# Patient Record
Sex: Female | Born: 1990 | Race: White | Hispanic: No | Marital: Married | Smoking: Never smoker
Health system: Southern US, Community
[De-identification: ages and names within clinical notes are randomized; demographics above are authoritative.]

## PROBLEM LIST (undated history)

## (undated) ENCOUNTER — Inpatient Hospital Stay (HOSPITAL_COMMUNITY): Payer: Self-pay

## (undated) DIAGNOSIS — N200 Calculus of kidney: Secondary | ICD-10-CM

## (undated) DIAGNOSIS — R87629 Unspecified abnormal cytological findings in specimens from vagina: Secondary | ICD-10-CM

## (undated) DIAGNOSIS — Z8619 Personal history of other infectious and parasitic diseases: Secondary | ICD-10-CM

## (undated) HISTORY — PX: COLPOSCOPY: SHX161

## (undated) HISTORY — PX: OTHER SURGICAL HISTORY: SHX169

## (undated) HISTORY — DX: Personal history of other infectious and parasitic diseases: Z86.19

## (undated) HISTORY — PX: PILONIDAL CYST EXCISION: SHX744

## (undated) HISTORY — DX: Calculus of kidney: N20.0

## (undated) HISTORY — PX: TONSILLECTOMY: SUR1361

## (undated) HISTORY — DX: Unspecified abnormal cytological findings in specimens from vagina: R87.629

---
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
## 2009-04-03 ENCOUNTER — Encounter: Admission: RE | Admit: 2009-04-03 | Discharge: 2009-04-03 | Payer: Self-pay | Admitting: Otolaryngology

## 2014-02-18 LAB — OB RESULTS CONSOLE GC/CHLAMYDIA
CHLAMYDIA, DNA PROBE: NEGATIVE
GC PROBE AMP, GENITAL: NEGATIVE

## 2014-02-18 LAB — OB RESULTS CONSOLE RUBELLA ANTIBODY, IGM: RUBELLA: IMMUNE

## 2014-02-18 LAB — OB RESULTS CONSOLE HEPATITIS B SURFACE ANTIGEN: Hepatitis B Surface Ag: NEGATIVE

## 2014-02-18 LAB — OB RESULTS CONSOLE ABO/RH: RH Type: POSITIVE

## 2014-02-18 LAB — OB RESULTS CONSOLE RPR: RPR: NONREACTIVE

## 2014-02-18 LAB — OB RESULTS CONSOLE ANTIBODY SCREEN: Antibody Screen: NEGATIVE

## 2014-02-18 LAB — OB RESULTS CONSOLE HIV ANTIBODY (ROUTINE TESTING): HIV: NONREACTIVE

## 2014-05-27 ENCOUNTER — Other Ambulatory Visit: Payer: Self-pay | Admitting: Urology

## 2014-05-27 ENCOUNTER — Encounter (HOSPITAL_COMMUNITY): Payer: Self-pay | Admitting: *Deleted

## 2014-05-27 ENCOUNTER — Inpatient Hospital Stay (HOSPITAL_COMMUNITY)
Admission: AD | Admit: 2014-05-27 | Discharge: 2014-05-29 | DRG: 781 | Disposition: A | Discharge status: Home / Self Care | Payer: BLUE CROSS/BLUE SHIELD | Source: Ambulatory Visit | Attending: Obstetrics and Gynecology | Admitting: Obstetrics and Gynecology

## 2014-05-27 ENCOUNTER — Ambulatory Visit (HOSPITAL_COMMUNITY)
Admission: RE | Admit: 2014-05-27 | Discharge: 2014-05-27 | Disposition: A | Discharge status: Home / Self Care | Payer: BLUE CROSS/BLUE SHIELD | Source: Ambulatory Visit | Attending: Obstetrics and Gynecology | Admitting: Obstetrics and Gynecology

## 2014-05-27 ENCOUNTER — Other Ambulatory Visit (HOSPITAL_COMMUNITY): Payer: Self-pay | Admitting: Obstetrics and Gynecology

## 2014-05-27 ENCOUNTER — Encounter (HOSPITAL_COMMUNITY): Payer: Self-pay | Admitting: Certified Registered Nurse Anesthetist

## 2014-05-27 DIAGNOSIS — Z3A23 23 weeks gestation of pregnancy: Secondary | ICD-10-CM | POA: Diagnosis present

## 2014-05-27 DIAGNOSIS — N132 Hydronephrosis with renal and ureteral calculous obstruction: Secondary | ICD-10-CM | POA: Diagnosis present

## 2014-05-27 DIAGNOSIS — O9989 Other specified diseases and conditions complicating pregnancy, childbirth and the puerperium: Secondary | ICD-10-CM

## 2014-05-27 DIAGNOSIS — R109 Unspecified abdominal pain: Secondary | ICD-10-CM | POA: Insufficient documentation

## 2014-05-27 DIAGNOSIS — Z87442 Personal history of urinary calculi: Secondary | ICD-10-CM | POA: Insufficient documentation

## 2014-05-27 DIAGNOSIS — O26892 Other specified pregnancy related conditions, second trimester: Principal | ICD-10-CM

## 2014-05-27 DIAGNOSIS — N200 Calculus of kidney: Secondary | ICD-10-CM

## 2014-05-27 DIAGNOSIS — N1330 Unspecified hydronephrosis: Secondary | ICD-10-CM | POA: Diagnosis present

## 2014-05-27 LAB — URINALYSIS, ROUTINE W REFLEX MICROSCOPIC
BILIRUBIN URINE: NEGATIVE
GLUCOSE, UA: NEGATIVE mg/dL
HGB URINE DIPSTICK: NEGATIVE
KETONES UR: 15 mg/dL — AB
LEUKOCYTES UA: NEGATIVE
Nitrite: NEGATIVE
PH: 6.5 (ref 5.0–8.0)
PROTEIN: NEGATIVE mg/dL
Specific Gravity, Urine: 1.015 (ref 1.005–1.030)
Urobilinogen, UA: 0.2 mg/dL (ref 0.0–1.0)

## 2014-05-27 LAB — COMPREHENSIVE METABOLIC PANEL
ALT: 20 U/L (ref 0–35)
AST: 24 U/L (ref 0–37)
Albumin: 3.3 g/dL — ABNORMAL LOW (ref 3.5–5.2)
Alkaline Phosphatase: 44 U/L (ref 39–117)
Anion gap: 8 (ref 5–15)
BILIRUBIN TOTAL: 0.6 mg/dL (ref 0.3–1.2)
BUN: 10 mg/dL (ref 6–23)
CALCIUM: 9.3 mg/dL (ref 8.4–10.5)
CO2: 23 mmol/L (ref 19–32)
CREATININE: 0.9 mg/dL (ref 0.50–1.10)
Chloride: 106 mEq/L (ref 96–112)
GFR, EST NON AFRICAN AMERICAN: 89 mL/min — AB (ref >90)
GLUCOSE: 80 mg/dL (ref 70–99)
Potassium: 3.8 mmol/L (ref 3.5–5.1)
SODIUM: 137 mmol/L (ref 135–145)
Total Protein: 6.2 g/dL (ref 6.0–8.3)

## 2014-05-27 LAB — CBC
HEMATOCRIT: 34 % — AB (ref 36.0–46.0)
HEMOGLOBIN: 11.5 g/dL — AB (ref 12.0–15.0)
MCH: 28.8 pg (ref 26.0–34.0)
MCHC: 33.8 g/dL (ref 30.0–36.0)
MCV: 85 fL (ref 78.0–100.0)
Platelets: 290 10*3/uL (ref 150–400)
RBC: 4 MIL/uL (ref 3.87–5.11)
RDW: 13.1 % (ref 11.5–15.5)
WBC: 13.7 10*3/uL — ABNORMAL HIGH (ref 4.0–10.5)

## 2014-05-27 MED ORDER — ONDANSETRON 8 MG/NS 50 ML IVPB
8.0000 mg | Freq: Four times a day (QID) | INTRAVENOUS | Status: DC
  Administered 2014-05-27 – 2014-05-28 (×3): 8 mg via INTRAVENOUS
  Filled 2014-05-27 (×6): qty 8

## 2014-05-27 MED ORDER — ONDANSETRON HCL 4 MG/2ML IJ SOLN: 4.0000 mg | Freq: Four times a day (QID) | INTRAMUSCULAR | Status: DC | PRN

## 2014-05-27 MED ORDER — SODIUM CHLORIDE 0.9 % IJ SOLN: 9.0000 mL | INTRAMUSCULAR | Status: DC | PRN

## 2014-05-27 MED ORDER — NALOXONE HCL 0.4 MG/ML IJ SOLN: 0.4000 mg | INTRAMUSCULAR | Status: DC | PRN

## 2014-05-27 MED ORDER — HYDROMORPHONE HCL 2 MG/ML IJ SOLN
2.0000 mg | Freq: Once | INTRAMUSCULAR | Status: AC
  Administered 2014-05-27: 2 mg via INTRAVENOUS
  Filled 2014-05-27: qty 1

## 2014-05-27 MED ORDER — DOCUSATE SODIUM 100 MG PO CAPS
100.0000 mg | ORAL_CAPSULE | Freq: Every day | ORAL | Status: DC
  Filled 2014-05-27 (×3): qty 1

## 2014-05-27 MED ORDER — ACETAMINOPHEN 325 MG PO TABS: 650.0000 mg | ORAL_TABLET | ORAL | Status: DC | PRN

## 2014-05-27 MED ORDER — PRENATAL MULTIVITAMIN CH
1.0000 | ORAL_TABLET | Freq: Every day | ORAL | Status: DC
  Filled 2014-05-27: qty 1

## 2014-05-27 MED ORDER — HYDROMORPHONE 0.3 MG/ML IV SOLN
INTRAVENOUS | Status: DC
  Administered 2014-05-27: 1.5 mg via INTRAVENOUS
  Administered 2014-05-27: 20:00:00 via INTRAVENOUS
  Administered 2014-05-28: 1.89 mg via INTRAVENOUS
  Administered 2014-05-28: 09:00:00 via INTRAVENOUS
  Administered 2014-05-28: 1.59 mg via INTRAVENOUS
  Filled 2014-05-27 (×2): qty 25

## 2014-05-27 MED ORDER — ZOLPIDEM TARTRATE 5 MG PO TABS: 5.0000 mg | ORAL_TABLET | Freq: Every evening | ORAL | Status: DC | PRN

## 2014-05-27 MED ORDER — HYDROMORPHONE HCL 2 MG/ML IJ SOLN
2.0000 mg | INTRAMUSCULAR | Status: DC | PRN
  Administered 2014-05-27: 2 mg via INTRAVENOUS
  Filled 2014-05-27: qty 1

## 2014-05-27 MED ORDER — CALCIUM CARBONATE ANTACID 500 MG PO CHEW
2.0000 | CHEWABLE_TABLET | ORAL | Status: DC | PRN
  Filled 2014-05-27: qty 2

## 2014-05-27 MED ORDER — DIPHENHYDRAMINE HCL 50 MG/ML IJ SOLN
12.5000 mg | Freq: Four times a day (QID) | INTRAMUSCULAR | Status: DC | PRN
  Administered 2014-05-27 – 2014-05-28 (×2): 12.5 mg via INTRAVENOUS
  Filled 2014-05-27 (×2): qty 1

## 2014-05-27 MED ORDER — LACTATED RINGERS IV SOLN
INTRAVENOUS | Status: DC
  Administered 2014-05-27 – 2014-05-28 (×2): via INTRAVENOUS
  Administered 2014-05-28: 1000 mL via INTRAVENOUS

## 2014-05-27 MED ORDER — DIPHENHYDRAMINE HCL 12.5 MG/5ML PO ELIX: 12.5000 mg | ORAL_SOLUTION | Freq: Four times a day (QID) | ORAL | Status: DC | PRN

## 2014-05-27 MED ORDER — ONDANSETRON 8 MG/NS 50 ML IVPB
8.0000 mg | Freq: Once | INTRAVENOUS | Status: AC
  Administered 2014-05-27: 8 mg via INTRAVENOUS
  Filled 2014-05-27: qty 8

## 2014-05-27 MED ORDER — PROMETHAZINE HCL 25 MG/ML IJ SOLN
12.5000 mg | Freq: Four times a day (QID) | INTRAMUSCULAR | Status: DC | PRN
  Administered 2014-05-28 (×2): 12.5 mg via INTRAVENOUS
  Filled 2014-05-27 (×2): qty 1

## 2014-05-27 MED ORDER — LACTATED RINGERS IV BOLUS (SEPSIS)
1000.0000 mL | Freq: Once | INTRAVENOUS | Status: AC
  Administered 2014-05-27: 1000 mL via INTRAVENOUS

## 2014-05-27 NOTE — MAU Note (Signed)
Rt flank started yesterday.  Just had US, MD called - she needs a stint.

## 2014-05-27 NOTE — Consult Note (Signed)
Reason for Consult: Right Hydronephrosis, Flank Pain, History of Neprholithiasis  Referring Physician: Zelphia CairoGretchen Adkins MD  Paulita FujitaLauren Pew is an 24 y.o. female.   HPI:  1 - Right Severe Hydronephrosis, Flank Pain in 23rd week of Pregnancy - acute onset right flank pain and nausea / vomitting 05/2014, feels similar to prior colic. Renal US with mod-severe Rt hydro, dilated proximal ureter and no right ureteral jet. Pain refractory to PO percocet. She has h/o nephrolithiasis. No fevers / bacteruria.   2 -  Nephrolithiasis -  Pre 2016 - one episode left ureteral stone passed medically.   PMH sig for TNA, pilonidal cyst surgery. G1P0.   History reviewed. No pertinent past medical history.  Past Surgical History  Procedure Laterality Date  . Tonsillectomy      History reviewed. No pertinent family history.  Social History:  reports that she has never smoked. She does not have any smokeless tobacco history on file. She reports that she does not drink alcohol or use illicit drugs.  Allergies:  Allergies  Allergen Reactions  . Amoxil [Amoxicillin] Hives  . Ceclor [Cefaclor] Hives    Medications: I have reviewed the patient's current medications.  Results for orders placed or performed during the hospital encounter of 05/27/14 (from the past 48 hour(s))  CBC     Status: Abnormal   Collection Time: 05/27/14  1:30 PM  Result Value Ref Range   WBC 13.7 (H) 4.0 - 10.5 K/uL   RBC 4.00 3.87 - 5.11 MIL/uL   Hemoglobin 11.5 (L) 12.0 - 15.0 g/dL   HCT 78.234.0 (L) 95.636.0 - 21.346.0 %   MCV 85.0 78.0 - 100.0 fL   MCH 28.8 26.0 - 34.0 pg   MCHC 33.8 30.0 - 36.0 g/dL   RDW 08.613.1 57.811.5 - 46.915.5 %   Platelets 290 150 - 400 K/uL  Urinalysis, Routine w reflex microscopic     Status: Abnormal   Collection Time: 05/27/14  2:40 PM  Result Value Ref Range   Color, Urine YELLOW YELLOW   APPearance CLEAR CLEAR   Specific Gravity, Urine 1.015 1.005 - 1.030   pH 6.5 5.0 - 8.0   Glucose, UA NEGATIVE NEGATIVE  mg/dL   Hgb urine dipstick NEGATIVE NEGATIVE   Bilirubin Urine NEGATIVE NEGATIVE   Ketones, ur 15 (A) NEGATIVE mg/dL   Protein, ur NEGATIVE NEGATIVE mg/dL   Urobilinogen, UA 0.2 0.0 - 1.0 mg/dL   Nitrite NEGATIVE NEGATIVE   Leukocytes, UA NEGATIVE NEGATIVE    Comment: MICROSCOPIC NOT DONE ON URINES WITH NEGATIVE PROTEIN, BLOOD, LEUKOCYTES, NITRITE, OR GLUCOSE <1000 mg/dL.    [place] Renal  05/27/2014   CLINICAL DATA:  RIGHT flank pain since yesterday morning, history kidney stones, [redacted] weeks pregnant  EXAM: RENAL/URINARY TRACT ULTRASOUND COMPLETE  COMPARISON:  CT abdomen and pelvis 07/07/2012  FINDINGS: Right Kidney:  Length: 11.6 cm. Moderate RIGHT hydronephrosis, renal pelvis 3.6 cm diameter. Normal cortical thickness and echogenicity. No mass or shadowing calculi are sonographically evident.  Left Kidney:  Length: 11.8 cm. Normal morphology without mass or hydronephrosis. No shadowing calcifications.  Bladder:  Well distended, no obvious mass or wall thickening. LEFT ureteral jet well visualized. Diminished RIGHT ureteral jet noted.  Small hyperechoic focus is seen posterior to urinary bladder without definite shadowing, 6 x 5 x 5 mm, appears farther LEFT than expected on transverse image for the position of the distal RIGHT ureter (image 26). Patient did have LEFT side pelvic phleboliths noted on prior CT.  IMPRESSION: Moderate RIGHT hydronephrosis  with diminished RIGHT ureteral jet suggesting RIGHT-side urinary tract obstruction.   Electronically Signed   By: Ulyses Southward M.D.   On: 05/27/2014 10:57    Review of Systems  Constitutional: Negative.   HENT: Negative.   Eyes: Negative.   Respiratory: Negative.   Cardiovascular: Negative.   Gastrointestinal: Positive for nausea and vomiting.  Genitourinary: Positive for urgency and flank pain.  Musculoskeletal: Negative.   Skin: Negative.   Neurological: Negative.   Endo/Heme/Allergies: Negative.   Psychiatric/Behavioral: Negative.    Blood  pressure 113/60, pulse 79, temperature 98.2 F (36.8 C), temperature source Oral, resp. rate 18. Physical Exam  Constitutional: She appears well-developed and well-nourished.  Husband and parents at bedside  HENT:  Head: Normocephalic.  Eyes: Pupils are equal, round, and reactive to light.  Neck: Normal range of motion.  Cardiovascular: Normal rate.   Respiratory: Effort normal.  GI: Soft.  Gravid uetrus  Genitourinary:  Significant Rt CVAT  Musculoskeletal: Normal range of motion.  Neurological: She is alert.  Skin: Skin is warm.  Psychiatric: She has a normal mood and affect. Her behavior is normal. Judgment and thought content normal.    Assessment/Plan:  1 - Right Hydronephrosis, Flank Pain, h/o Nephrolithiasis in 23 wk Pregnancy - very high suspicion ureteral stone.   We discussed that stones / possible stones in pregnancy are a potentially dangerous entity with potential risks to both mother and baby. We discussed risks including infection (UTI) with obstruction, early labor induced by pain or procedure, and miscairrage. I mentioned that the definitive diagnosis is often complicated by the overall desire to reduce radiation exposure (if possible) with preferred ultrasound exams but that in certain circumstances proceeding with axial imaging (CT Scan) is indeed warranted and that absolute risks to mother and baby are small. We discussed management strategies including medical expulsive therapy / surveillance (with and without proph ABX), definitive ureteroscopy, or renal urinary diversion with stents or neph tubes, each with their own inherent risks, and that there is no uniformly accepted approach. I explained that our inclination towards definitive management and axial imaging is greater with increasing gestation as the imaging and surgical risks are lessened with increasing gestational age. I mentioned that stones are indeed more common in pregnancy and that at a minimum, the  patient should undergo CT stone after delivery to verify stone burden and strongly consider metabolic evaluation and future pre-pregnancy urologic evaluations to minimize risk to future pregnancies.   Discussed management options of CT v. Medical therapy v. Cysto retrograde / ureteroscopy for diagnostic and theraputic intent. They want to proceed with ureteroscopy with interval medical therapy.  We discussed ureteroscopic stone manipulation with basketing and laser-lithotripsy in detail. We discussed risks including bleeding, infection, damage to kidney / ureter bladder, rarely loss of kidney. We discussed anesthetic risks and rare but serious surgical complications including DVT, PE, MI, and mortality. We specifically addressed that in 5-10% of cases a staged approach is required with stenting followed by re-attempt ureteroscopy if anatomy unfavorable.   The patient voiced understanding and wises to proceed tomorrow as planned.   Will plan for maximally shielding fetus, minimal fluoroscopy, fetal monitoring, transfer back to antenatal unit post-op.  2 - Nephrolithiasis - kidneys appear presently stone free by [place], likely Rt ureteral stone as per above.    Janeisha Ryle 05/27/2014, 3:43 PM

## 2014-05-27 NOTE — H&P (Signed)
Cheryl Collins is a 24 y.o. female presenting for nephrolithiasis with right hydronephrosis, pain, & n/v.  Pt denies ctx, vb and lof.   History OB History    Gravida Para Term Preterm AB TAB SAB Ectopic Multiple Living   1              History reviewed. No pertinent past medical history. Past Surgical History  Procedure Laterality Date  . Tonsillectomy     Family History: family history is not on file. Social History:  reports that she has never smoked. She does not have any smokeless tobacco history on file. She reports that she does not drink alcohol or use illicit drugs.   Prenatal Transfer Tool  Maternal Diabetes: no Genetic Screening:  Maternal Ultrasounds/Referrals: rt hydro with decreased ureteral jets Fetal Ultrasounds or other Referrals:  none Maternal Substance Abuse:  none Significant Maternal Medications:  none Significant Maternal Lab Results:  None Other Comments:  None  ROS    Blood pressure 113/60, pulse 79, temperature 98.2 F (36.8 C), temperature source Oral, resp. rate 18, height 5\' 4"  (1.626 m), weight 80.287 kg (177 lb). Exam Physical Exam  Gen - NAD Abd - gravid, NT PV - deferred Prenatal labs: ABO, Rh:   Antibody:   Rubella:   RPR:    HBsAg:    HIV:    GBS:     Assessment/Plan: Nephrolithiasis with right hydronephrosis  Uro consulted and planning treatment in am - NPO after MN Admit for IVF and pain control   Cheryl Collins 05/27/2014, 8:18 PM

## 2014-05-27 NOTE — MAU Provider Note (Signed)
Chief Complaint:  Nephrolithiasis   First Provider Initiated Contact with Patient 05/27/14 1301     HPI: Cheryl Collins is a 24 y.o. G1P0 at [redacted]w[redacted]d who instructed to come to maternity admissions IV pain meds and antiemetics. Renal US this morning showed moderate right hydronephrosis and decrease right ureteral jets suggesting obstruction. Told she may need a stint. Has Hx Kidney stones on Left. Reports severe left flank pain and frequent N/V. No pain relief from Percocet. Able to void.   Denies contractions, leakage of fluid or vaginal bleeding. Good fetal movement.   Past Medical History: Kidney stones  Past obstetric history: OB History  Gravida Para Term Preterm AB SAB TAB Ectopic Multiple Living  1             # Outcome Date GA Lbr Len/2nd Weight Sex Delivery Anes PTL Lv  1 Current               Past Surgical History: Past Surgical History  Procedure Laterality Date  . Tonsillectomy       Family History: History reviewed. No pertinent family history.  Social History: History  Substance Use Topics  . Smoking status: Never Smoker   . Smokeless tobacco: Not on file  . Alcohol Use: No    Allergies:  Allergies  Allergen Reactions  . Amoxil [Amoxicillin] Hives  . Ceclor [Cefaclor] Hives    Meds:  Prescriptions prior to admission  Medication Sig Dispense Refill Last Dose  . acetaminophen (TYLENOL) 500 MG tablet Take 1,000 mg by mouth every 6 (six) hours as needed for moderate pain.   05/26/2014 at Unknown time  . nitrofurantoin (MACRODANTIN) 100 MG capsule Take 100 mg by mouth 2 (two) times daily. 7 days supply started 05-26-14   05/27/2014 at Unknown time  . oxyCODONE-acetaminophen (PERCOCET) 7.5-325 MG per tablet Take 1 tablet by mouth every 4 (four) hours as needed for pain.   05/27/2014 at Unknown time  . Prenatal Vit-Fe Fumarate-FA (PRENATAL MULTIVITAMIN) TABS tablet Take 1 tablet by mouth daily at 12 noon.   Past Week at Unknown time    ROS: Pertinent findings in history  of present illness. Denies fever, chills, diarrhea, constipation, abd pain, VB, hematuria, dysuria, urgency, frequency.   Physical Exam  Blood pressure 121/48, pulse 77, temperature 98.2 F (36.8 C), temperature source Oral, resp. rate 18, height  (1.626 m), weight 80.287 kg (177 lb), SpO2 100 %.   GENERAL: Well-developed, well-nourished female in moderate distress.  HEENT: normocephalic HEART: normal rate RESP: normal effort ABDOMEN: Soft, non-tender, gravid appropriate for gestational age. Pos left CVAT.  EXTREMITIES: Nontender, no edema NEURO: alert and oriented SPECULUM EXAM: Deferred    FHT:  Baseline 140's , min-moderate variability, 10x10 accelerations present, no decelerations Contractions: None   Labs: Results for orders placed or performed during the hospital encounter of 05/27/14 (from the past 24 hour(s))  CBC     Status: Abnormal   Collection Time: 05/27/14  1:30 PM  Result Value Ref Range   WBC 13.7 (H) 4.0 - 10.5 K/uL   RBC 4.00 3.87 - 5.11 MIL/uL   Hemoglobin 11.5 (L) 12.0 - 15.0 g/dL   HCT 16.1 (L) 09.6 - 04.5 %   MCV 85.0 78.0 - 100.0 fL   MCH 28.8 26.0 - 34.0 pg   MCHC 33.8 30.0 - 36.0 g/dL   RDW 40.9 81.1 - 91.4 %   Platelets 290 150 - 400 K/uL  Comprehensive metabolic panel     Status: Abnormal  Collection Time: 05/27/14  1:30 PM  Result Value Ref Range   Sodium 137 135 - 145 mmol/L   Potassium 3.8 3.5 - 5.1 mmol/L   Chloride 106 96 - 112 mEq/L   CO2 23 19 - 32 mmol/L   Glucose, Bld 80 70 - 99 mg/dL   BUN 10 6 - 23 mg/dL   Creatinine, Ser 1.610.90 0.50 - 1.10 mg/dL   Calcium 9.3 8.4 - 09.610.5 mg/dL   Total Protein 6.2 6.0 - 8.3 g/dL   Albumin 3.3 (L) 3.5 - 5.2 g/dL   AST 24 0 - 37 U/L   ALT 20 0 - 35 U/L   Alkaline Phosphatase 44 39 - 117 U/L   Total Bilirubin 0.6 0.3 - 1.2 mg/dL   GFR calc non Af Amer 89 (L) >90 mL/min   GFR calc Af Amer >90 >90 mL/min   Anion gap 8 5 - 15  Urinalysis, Routine w reflex microscopic     Status: Abnormal    Collection Time: 05/27/14  2:40 PM  Result Value Ref Range   Color, Urine YELLOW YELLOW   APPearance CLEAR CLEAR   Specific Gravity, Urine 1.015 1.005 - 1.030   pH 6.5 5.0 - 8.0   Glucose, UA NEGATIVE NEGATIVE mg/dL   Hgb urine dipstick NEGATIVE NEGATIVE   Bilirubin Urine NEGATIVE NEGATIVE   Ketones, ur 15 (A) NEGATIVE mg/dL   Protein, ur NEGATIVE NEGATIVE mg/dL   Urobilinogen, UA 0.2 0.0 - 1.0 mg/dL   Nitrite NEGATIVE NEGATIVE   Leukocytes, UA NEGATIVE NEGATIVE    Imaging:  [place] Renal  05/27/2014   CLINICAL DATA:  RIGHT flank pain since yesterday morning, history kidney stones, [redacted] weeks pregnant  EXAM: RENAL/URINARY TRACT ULTRASOUND COMPLETE  COMPARISON:  CT abdomen and pelvis 07/07/2012  FINDINGS: Right Kidney:  Length: 11.6 cm. Moderate RIGHT hydronephrosis, renal pelvis 3.6 cm diameter. Normal cortical thickness and echogenicity. No mass or shadowing calculi are sonographically evident.  Left Kidney:  Length: 11.8 cm. Normal morphology without mass or hydronephrosis. No shadowing calcifications.  Bladder:  Well distended, no obvious mass or wall thickening. LEFT ureteral jet well visualized. Diminished RIGHT ureteral jet noted.  Small hyperechoic focus is seen posterior to urinary bladder without definite shadowing, 6 x 5 x 5 mm, appears farther LEFT than expected on transverse image for the position of the distal RIGHT ureter (image 26). Patient did have LEFT side pelvic phleboliths noted on prior CT.  IMPRESSION: Moderate RIGHT hydronephrosis with diminished RIGHT ureteral jet suggesting RIGHT-side urinary tract obstruction.   Electronically Signed   By: Ulyses SouthwardMark  Boles M.D.   On: 05/27/2014 10:57   MAU Course: [place] Renal US, severe pain w/ Dr. Renaldo FiddlerAdkins. Ordered urology consult ASAP. Discussed pt w/ Urologist Dr. Berneice HeinrichManny. Pt needs ureteroscopy ASAP. Will come to MAU for BS consult in ~2 hours. Will discuss POC w. Dr. Renaldo FiddlerAdkins.   Assessment: Possible right ureteral stone w/ obsruction.  [redacted]  weeks gestation Pain and N/V uncontrolled by PO meds.   Plan: Admit to Antenatal per consult w/ Dr. Renaldo FiddlerAdkins. Plan ureteroscopy tomorrow NPO after NM IV fluids, pain meds, antiemetics.  [place]oun[place]ia Soren Pigman, CNM 05/27/2014 11:01 PM

## 2014-05-28 ENCOUNTER — Inpatient Hospital Stay (HOSPITAL_COMMUNITY): Payer: BLUE CROSS/BLUE SHIELD

## 2014-05-28 ENCOUNTER — Inpatient Hospital Stay (HOSPITAL_COMMUNITY): Payer: BLUE CROSS/BLUE SHIELD | Admitting: Anesthesiology

## 2014-05-28 ENCOUNTER — Encounter (HOSPITAL_COMMUNITY): Payer: Self-pay | Admitting: *Deleted

## 2014-05-28 ENCOUNTER — Encounter (HOSPITAL_COMMUNITY)
Admission: AD | Disposition: A | Discharge status: Home / Self Care | Payer: Self-pay | Source: Ambulatory Visit | Attending: Obstetrics and Gynecology

## 2014-05-28 HISTORY — PX: CYSTOSCOPY WITH STENT PLACEMENT: SHX5790

## 2014-05-28 HISTORY — PX: URETEROSCOPY: SHX842

## 2014-05-28 HISTORY — PX: CYSTOSCOPY W/ RETROGRADES: SHX1426

## 2014-05-28 SURGERY — CYSTOSCOPY, WITH RETROGRADE PYELOGRAM
Anesthesia: General | Laterality: Right

## 2014-05-28 MED ORDER — PROPOFOL 10 MG/ML IV BOLUS
INTRAVENOUS | Status: DC | PRN
  Administered 2014-05-28: 160 mg via INTRAVENOUS

## 2014-05-28 MED ORDER — 0.9 % SODIUM CHLORIDE (POUR BTL) OPTIME
TOPICAL | Status: DC | PRN
  Administered 2014-05-28: 1000 mL

## 2014-05-28 MED ORDER — FENTANYL CITRATE 0.05 MG/ML IJ SOLN
INTRAMUSCULAR | Status: AC
  Filled 2014-05-28: qty 4

## 2014-05-28 MED ORDER — SODIUM CHLORIDE 0.9 % IR SOLN
Status: DC | PRN
  Administered 2014-05-28: 1000 mL

## 2014-05-28 MED ORDER — PROPOFOL 10 MG/ML IV BOLUS
INTRAVENOUS | Status: AC
  Filled 2014-05-28: qty 20

## 2014-05-28 MED ORDER — ACETAMINOPHEN 325 MG PO TABS: 325.0000 mg | ORAL_TABLET | ORAL | Status: DC | PRN

## 2014-05-28 MED ORDER — OXYCODONE-ACETAMINOPHEN 5-325 MG PO TABS
1.0000 | ORAL_TABLET | ORAL | Status: DC | PRN
  Administered 2014-05-28 (×2): 2 via ORAL
  Filled 2014-05-28 (×2): qty 2

## 2014-05-28 MED ORDER — ONDANSETRON HCL 4 MG/2ML IJ SOLN
INTRAMUSCULAR | Status: DC | PRN
  Administered 2014-05-28: 4 mg via INTRAVENOUS

## 2014-05-28 MED ORDER — PHENYLEPHRINE HCL 10 MG/ML IJ SOLN
INTRAMUSCULAR | Status: AC
  Filled 2014-05-28: qty 1

## 2014-05-28 MED ORDER — GENTAMICIN IN SALINE 1.6-0.9 MG/ML-% IV SOLN: 80.0000 mg | INTRAVENOUS | Status: DC

## 2014-05-28 MED ORDER — ACETAMINOPHEN 160 MG/5ML PO SOLN
325.0000 mg | ORAL | Status: DC | PRN
  Filled 2014-05-28: qty 20.3

## 2014-05-28 MED ORDER — GENTAMICIN SULFATE 40 MG/ML IJ SOLN
80.3000 mg | INTRAMUSCULAR | Status: DC
  Filled 2014-05-28: qty 2

## 2014-05-28 MED ORDER — GENTAMICIN SULFATE 40 MG/ML IJ SOLN
400.0000 mg | Freq: Once | INTRAVENOUS | Status: AC
  Administered 2014-05-28: 400 mg via INTRAVENOUS
  Filled 2014-05-28: qty 10

## 2014-05-28 MED ORDER — FENTANYL CITRATE 0.05 MG/ML IJ SOLN
INTRAMUSCULAR | Status: DC | PRN
  Administered 2014-05-28: 50 ug via INTRAVENOUS

## 2014-05-28 MED ORDER — SODIUM CHLORIDE 0.9 % IR SOLN
Status: DC | PRN
  Administered 2014-05-28: 3000 mL

## 2014-05-28 MED ORDER — FENTANYL CITRATE 0.05 MG/ML IJ SOLN: 25.0000 ug | INTRAMUSCULAR | Status: DC | PRN

## 2014-05-28 MED ORDER — ONDANSETRON HCL 4 MG/2ML IJ SOLN
INTRAMUSCULAR | Status: AC
  Filled 2014-05-28: qty 2

## 2014-05-28 MED ORDER — SIMETHICONE 80 MG PO CHEW
160.0000 mg | CHEWABLE_TABLET | Freq: Four times a day (QID) | ORAL | Status: DC | PRN
  Administered 2014-05-28: 160 mg via ORAL
  Filled 2014-05-28: qty 2

## 2014-05-28 MED ORDER — PHENYLEPHRINE HCL 10 MG/ML IJ SOLN
10.0000 mg | INTRAVENOUS | Status: DC | PRN
  Administered 2014-05-28: 25 ug/min via INTRAVENOUS

## 2014-05-28 MED ORDER — MIDAZOLAM HCL 2 MG/2ML IJ SOLN
INTRAMUSCULAR | Status: AC
  Filled 2014-05-28: qty 2

## 2014-05-28 MED ORDER — SUCCINYLCHOLINE CHLORIDE 20 MG/ML IJ SOLN
INTRAMUSCULAR | Status: DC | PRN
  Administered 2014-05-28: 100 mg via INTRAVENOUS

## 2014-05-28 SURGICAL SUPPLY — 22 items
BAG URINE DRAINAGE (UROLOGICAL SUPPLIES) IMPLANT
BASKET LASER NITINOL 1.9FR (BASKET) ×2 IMPLANT
BASKET STNLS GEMINI 4WIRE 3FR (BASKET) IMPLANT
BASKET ZERO TIP NITINOL 2.4FR (BASKET) IMPLANT
CATH INTERMIT  6FR 70CM (CATHETERS) ×2 IMPLANT
CLOTH BEACON ORANGE TIMEOUT ST (SAFETY) ×2 IMPLANT
DRAPE CAMERA CLOSED 9X96 (DRAPES) ×2 IMPLANT
ELECT REM PT RETURN 9FT ADLT (ELECTROSURGICAL)
ELECTRODE REM PT RTRN 9FT ADLT (ELECTROSURGICAL) IMPLANT
FIBER LASER FLEXIVA 200 (UROLOGICAL SUPPLIES) IMPLANT
FIBER LASER FLEXIVA 365 (UROLOGICAL SUPPLIES) IMPLANT
GLOVE BIOGEL M STRL SZ7.5 (GLOVE) ×2 IMPLANT
GOWN STRL REUS W/TWL LRG LVL3 (GOWN DISPOSABLE) ×4 IMPLANT
GUIDEWIRE ANG ZIPWIRE 038X150 (WIRE) ×2 IMPLANT
GUIDEWIRE STR DUAL SENSOR (WIRE) ×2 IMPLANT
IV NS IRRIG 3000ML ARTHROMATIC (IV SOLUTION) ×2 IMPLANT
PACK CYSTO (CUSTOM PROCEDURE TRAY) ×2 IMPLANT
SHEATH ACCESS URETERAL 24CM (SHEATH) ×2 IMPLANT
STENT POLARIS 5FRX26 (STENTS) ×2 IMPLANT
SYRINGE 10CC LL (SYRINGE) IMPLANT
SYRINGE IRR TOOMEY STRL 70CC (SYRINGE) IMPLANT
TUBE FEEDING 8FR 16IN STR KANG (MISCELLANEOUS) ×2 IMPLANT

## 2014-05-28 NOTE — Anesthesia Preprocedure Evaluation (Signed)
Anesthesia Evaluation  Patient identified by MRN, date of birth, ID band Patient awake    Reviewed: Allergy & Precautions, NPO status , Patient's Chart, lab work & pertinent test results  Airway Mallampati: II  TM Distance: >3 FB Neck ROM: Full    Dental no notable dental hx.    Pulmonary neg pulmonary ROS,  breath sounds clear to auscultation  Pulmonary exam normal       Cardiovascular negative cardio ROS  Rhythm:Regular Rate:Normal     Neuro/Psych negative neurological ROS  negative psych ROS   GI/Hepatic negative GI ROS, Neg liver ROS,   Endo/Other  negative endocrine ROS  Renal/GU negative Renal ROS  negative genitourinary   Musculoskeletal negative musculoskeletal ROS (+)   Abdominal   Peds negative pediatric ROS (+)  Hematology negative hematology ROS (+)   Anesthesia Other Findings   Reproductive/Obstetrics (+) Pregnancy                             Anesthesia Physical Anesthesia Plan  ASA: II  Anesthesia Plan: General   Post-op Pain Management:    Induction: Intravenous  Airway Management Planned: Oral ETT  Additional Equipment:   Intra-op Plan:   Post-operative Plan: Extubation in OR  Informed Consent: I have reviewed the patients History and Physical, chart, labs and discussed the procedure including the risks, benefits and alternatives for the proposed anesthesia with the patient or authorized representative who has indicated his/her understanding and acceptance.   Dental advisory given  Plan Discussed with: CRNA  Anesthesia Plan Comments:         Anesthesia Quick Evaluation

## 2014-05-28 NOTE — Anesthesia Preprocedure Evaluation (Deleted)
Anesthesia Evaluation Anesthesia Physical Anesthesia Plan Anesthesia Quick Evaluation  

## 2014-05-28 NOTE — Brief Op Note (Signed)
05/27/2014 - 05/28/2014  11:05 AM  PATIENT:  Cheryl Collins  24 y.o. female  PRE-OPERATIVE DIAGNOSIS:  RIGHT HYDRONEPHROSIS, URETERAL STONE, [redacted] WEEKS PREGNANT  POST-OPERATIVE DIAGNOSIS:  RIGHT HYDRONEPHROSIS, URETERAL STONE, [redacted] WEEKS PREGNANT  PROCEDURE:  Procedure(s): CYSTOSCOPY WITH RETROGRADE PYELOGRAM (Right) URETEROSCOPY/STONE BASKET EXTRACTION (Right) CYSTOSCOPY WITH STENT PLACEMENT (Right)  SURGEON:  Surgeon(s) and Role:    * Sebastian Acheheodore Litisha Guagliardo, MD - Primary  PHYSICIAN ASSISTANT:   ASSISTANTS: none   ANESTHESIA:   general  EBL:     BLOOD ADMINISTERED:none  DRAINS: none   LOCAL MEDICATIONS USED:  NONE  SPECIMEN:  Source of Specimen:  Rt ureteral stone  DISPOSITION OF SPECIMEN:  [facility] for compositional analysis  COUNTS:  YES  TOURNIQUET:  * No tourniquets in log *  DICTATION: .Other Dictation: Dictation Number [id number]  PLAN OF CARE: Admit to inpatient   PATIENT DISPOSITION:  PACU - hemodynamically stable.   Delay start of Pharmacological VTE agent (>24hrs) due to surgical blood loss or risk of bleeding: yes

## 2014-05-28 NOTE — Progress Notes (Signed)
Subjective:  1 - Right Severe Hydronephrosis, Flank Pain in 23rd week of Pregnancy - acute onset right flank pain and nausea / vomitting 05/2014, feels similar to prior colic. Renal US with mod-severe Rt hydro, dilated proximal ureter and no right ureteral jet. Pain refractory to PO percocet. She has h/o nephrolithiasis. No fevers / bacteruria.   2 - Nephrolithiasis -  Pre 2016 - one episode left ureteral stone passed medically.   Today Cheryl Collins is seen to proceed with cysto and right ureteroscopy with stone manipulation versus stenting. No interval fevers or stone passage.   Objective: Vital signs in last 24 hours: Temp:  [98.2 F (36.8 C)-99.6 F (37.6 C)] 98.2 F (36.8 C) (01/05 1455) Pulse Rate:  [73-88] 73 (01/06 0001) Resp:  [18] 18 (01/06 0001) BP: (109-137)/(41-67) 109/41 mmHg (01/06 0001) SpO2:  [100 %] 100 % (01/05 2012) Weight:  [80.287 kg (177 lb)] 80.287 kg (177 lb) (01/05 1724)    Intake/Output from previous day:   Intake/Output this shift:    General appearance: alert, cooperative and appears stated age Head: Normocephalic, without obvious abnormality, atraumatic Eyes: negative Nose: Nares normal. Septum midline. Mucosa normal. No drainage or sinus tenderness. Throat: lips, mucosa, and tongue normal; teeth and gums normal Neck: supple, symmetrical, trachea midline Back: symmetric, no curvature. ROM normal. No CVA tenderness. Resp: non-labored on room air Cardio: Nl rate GI: soft, non-tender; bowel sounds normal; no masses,  no organomegaly Pelvic: gravid uterus to just below umbilicus Extremities: extremities normal, atraumatic, no cyanosis or edema Skin: Skin color, texture, turgor normal. No rashes or lesions Lymph nodes: Cervical, supraclavicular, and axillary nodes normal. Neurologic: Grossly normal Stable Rt CVAT  Lab Results:   Recent Labs  05/27/14 1330  WBC 13.7*  HGB 11.5*  HCT 34.0*  PLT 290   BMET  Recent Labs  05/27/14 1330  NA  137  K 3.8  CL 106  CO2 23  GLUCOSE 80  BUN 10  CREATININE 0.90  CALCIUM 9.3   PT/INR No results for input(s): LABPROT, INR in the last 72 hours. ABG No results for input(s): PHART, HCO3 in the last 72 hours.  Invalid input(s): PCO2, PO2  Studies/Results: US Renal  05/27/2014   CLINICAL DATA:  RIGHT flank pain since yesterday morning, history kidney stones, [redacted] weeks pregnant  EXAM: RENAL/URINARY TRACT ULTRASOUND COMPLETE  COMPARISON:  CT abdomen and pelvis 07/07/2012  FINDINGS: Right Kidney:  Length: 11.6 cm. Moderate RIGHT hydronephrosis, renal pelvis 3.6 cm diameter. Normal cortical thickness and echogenicity. No mass or shadowing calculi are sonographically evident.  Left Kidney:  Length: 11.8 cm. Normal morphology without mass or hydronephrosis. No shadowing calcifications.  Bladder:  Well distended, no obvious mass or wall thickening. LEFT ureteral jet well visualized. Diminished RIGHT ureteral jet noted.  Small hyperechoic focus is seen posterior to urinary bladder without definite shadowing, 6 x 5 x 5 mm, appears farther LEFT than expected on transverse image for the position of the distal RIGHT ureter (image 26). Patient did have LEFT side pelvic phleboliths noted on prior CT.  IMPRESSION: Moderate RIGHT hydronephrosis with diminished RIGHT ureteral jet suggesting RIGHT-side urinary tract obstruction.   Electronically Signed   By: Cheryl Collins M.D.   On: 05/27/2014 10:57    Anti-infectives: Anti-infectives    Start     Dose/Rate Route Frequency Ordered Stop   05/28/14 0900  gentamicin (GARAMYCIN) IVPB 80 mg  Status:  Discontinued     80 mg100 mL/hr over 30 Minutes Intravenous 30 min  pre-op 05/28/14 0359 05/28/14 0401   05/28/14 0900  gentamicin (GARAMYCIN) 80.3 mg in dextrose 5 % 50 mL IVPB     80.3 mg104 mL/hr over 30 Minutes Intravenous 30 min pre-op 05/28/14 0402     05/28/14 0355  gentamicin (GARAMYCIN) IVPB 80 mg  Status:  Discontinued     80 mg100 mL/hr over 30 Minutes  Intravenous 30 min pre-op 05/28/14 0355 05/28/14 0358      Assessment/Plan:  1 - Right Hydronephrosis, Flank Pain, h/o Nephrolithiasis in 24 wk Pregnancy - Proceed as planned this AM with cysto, right ureteroscopy and stone manipulation and / or stenting pending intraoperative findings. Risks and benefits to mother and fetus reiterated, pt voiced understanding and desires to proceed.  J. Cheryl Collins, Cheryl Collins 05/28/2014

## 2014-05-28 NOTE — Transfer of Care (Signed)
Immediate Anesthesia Transfer of Care Note  Patient: Cheryl Collins  Procedure(s) Performed: Procedure(s): CYSTOSCOPY WITH RETROGRADE PYELOGRAM (Right) URETEROSCOPY/STONE BASKET EXTRACTION (Right) CYSTOSCOPY WITH STENT PLACEMENT (Right)  Patient Location: PACU  Anesthesia Type:General  Level of Consciousness: awake, sedated and patient cooperative  Airway & Oxygen Therapy: Patient Spontanous Breathing and Patient connected to face mask oxygen  Post-op Assessment: Report given to PACU RN and Post -op Vital signs reviewed and stable  Post vital signs: Reviewed and stable  Complications: No apparent anesthesia complications

## 2014-05-28 NOTE — Discharge Instructions (Signed)
1 - You may have urinary urgency (bladder spasms) and bloody urine on / off with stent in place. This is normal.  2 - Call MD or go to ER for fever >102, severe pain / nausea / vomiting not relieved by medications, or acute change in medical status  3 - Remove tethered stent on Friday morning by pulling on string, then blue/white plastic, and discarding.

## 2014-05-28 NOTE — Anesthesia Postprocedure Evaluation (Signed)
  Anesthesia Post-op Note  Patient: Paulita FujitaLauren Life  Procedure(s) Performed: Procedure(s): CYSTOSCOPY WITH RETROGRADE PYELOGRAM (Right) URETEROSCOPY/STONE BASKET EXTRACTION (Right) CYSTOSCOPY WITH STENT PLACEMENT (Right)  Patient Location: PACU  Anesthesia Type:General  Level of Consciousness: awake  Airway and Oxygen Therapy: Patient Spontanous Breathing  Post-op Pain: mild  Post-op Assessment: Post-op Vital signs reviewed, Patient's Cardiovascular Status Stable, Respiratory Function Stable, Patent Airway, No signs of Nausea or vomiting and Pain level controlled  Post-op Vital Signs: Reviewed and stable  Last Vitals:  Filed Vitals:   05/28/14 1300  BP: 122/54  Pulse: 97  Temp:   Resp: 16    Complications: No apparent anesthesia complications

## 2014-05-28 NOTE — Progress Notes (Signed)
Montez MoritaErin Hampton, R.N.-O.B. Rapid Response R.N. With patient.

## 2014-05-28 NOTE — Anesthesia Procedure Notes (Signed)
Procedure Name: Intubation Date/Time: 05/28/2014 10:28 AM Performed by: Paulla DollyJOYCE, Jerel Sardina A Pre-anesthesia Checklist: Patient identified, Timeout performed, Emergency Drugs available, Suction available and Patient being monitored Patient Re-evaluated:Patient Re-evaluated prior to inductionOxygen Delivery Method: Circle system utilized and Simple face mask Preoxygenation: Pre-oxygenation with 100% oxygen Intubation Type: Combination inhalational/ intravenous induction and Cricoid Pressure applied Ventilation: Mask ventilation without difficulty Laryngoscope Size: Mac and 4 Grade View: Grade I Tube type: Oral Tube size: 7.0 mm Number of attempts: 1 Placement Confirmation: ETT inserted through vocal cords under direct vision,  breath sounds checked- equal and bilateral and positive ETCO2 Secured at: 22 cm Tube secured with: Tape Dental Injury: Teeth and Oropharynx as per pre-operative assessment

## 2014-05-28 NOTE — Progress Notes (Signed)
[facility]

## 2014-05-29 ENCOUNTER — Encounter (HOSPITAL_COMMUNITY): Payer: Self-pay | Admitting: Urology

## 2014-05-29 MED ORDER — NITROFURANTOIN MACROCRYSTAL 100 MG PO CAPS: 100.0000 mg | ORAL_CAPSULE | Freq: Every day | ORAL | Status: DC

## 2014-05-29 NOTE — Op Note (Signed)
NAMHassan Collins:  Metzger, Cheryl Collins                ACCOUNT NO.:  [id number]  MEDICAL RECORD NO.:  [id number]  LOCATION:  9156                          FACILITY:  [facility]  PHYSICIAN:  Sebastian Acheheodore Shadiyah Wernli, MD     DATE OF BIRTH:  [birth year]  DATE OF PROCEDURE: 05/28/2014 DATE OF DISCHARGE:                              OPERATIVE REPORT   DIAGNOSES:  Right hydronephrosis refractory to renal colic.  Right ureteral stone.  PROCEDURES: 1. Cystoscopy with right retrograde pyelogram and interpretation. 2. Right ureteroscopy with basketing of stone. 3. Insertion of right ureteral stent, 5 x 26 Polaris with tether to     the thigh.  ESTIMATED BLOOD LOSS:  Nil.  COMPLICATIONS:  None.  SPECIMEN:  Small right ureteral stone for compositional analysis.  FINDINGS: 1. Moderate-to-severe right hydroureteronephrosis with significant     proximal tortuosity. 2. Multifocal right ureteral stones, distal, impacted, matrix stone,     proximal, smaller mineral base stone. 3. Complete resolution of all stone fragments larger than 1/3 mm     verified by complete ureterorenoscopy. 4. Excellent placement of right ureteral stent proximal in renal     pelvis, distal end, urinary bladder with tether to the thigh.  INDICATION:  Ms. Cheryl Collins is a very pleasant 24 year old lady in the midst of her first pregnancy who has a history of nephrolithiasis.  [facility] with approximately 1- day prodrome, severe right colicky flank pain, consistent with prior nephrolithiasis, this is requiring IV narcotics, IV pain meds, IV nausea meds and she also had significant nausea, vomiting with this.  Renal ultrasound corroborated significant hydroureteronephrosis, no definitive stone seen, no large intrarenal stones.  This picture was most consistent with likely right ureteral stone as the cause of this. Options were discussed including continued medical therapy versus stenting alone versus attempted  ureteroscopy with definitive management. She wished to proceed with the latter.  Informed consent was obtained and placed in the medical record.  PROCEDURE IN DETAIL:  The patient being Cheryl Collins, was verified. Procedure being right ureteroscopic stone manipulation was confirmed. Procedure was carried out.  Time-out was performed.  Intravenous antibiotics were administered.  General endotracheal anesthesia was induced.  The patient was placed into a low lithotomy position and sterile field was created by prepping and draping the patient's vagina, introitus, and proximal thighs using iodine x3.  Next, small bump was placed under her right flank to help position the gravid uterus away from any potential fluoroscopy.  Cystourethroscopy was then performed using 22-French rigid cystoscope with 12-degree offset lens.  Inspection of the urinary bladder revealed no diverticula, calcifications, or papular lesions.  The right ureteral orifice was gently cannulated with a 6-French end-hole catheter and very gentle right retrograde pyelogram was obtained.  Right retrograde pyelogram demonstrated a single system right kidney with severe hydroureteronephrosis with some proximal tortuosity. Purposely, images were obtained using pulse-mode, low-dose fluoroscopy and only of the kidney to avoid any radiation directly to the gravid uterus.  A 0.038 zip wire was advanced at the level of the upper pole and set aside as a safety wire.  Next, semi-rigid ureteroscopy was performed in the distal four-fifth of the  right ureter alongside a separate Sensor working wire in the level of the distal ureter and area of impacted likely matrix stone was seen, this was grasped with a basket, which cut this nearly completely disintegrate, this was removed in its entirety.  There was significant dilation of the ureter proximal to this as the goal today was to verify stone-free status.  The proximal ureter was inspected,  which again was quite dilated, but no additional calcifications were found.  It was felt that inspection of the kidney was clearly warranted as well to maximally rule out additional stent. As such, the semi-rigid ureteroscope was exchanged for the 12/14, 24-cm ureteral access sheath over the Sensor working wire to the level of the proximal ureter and flexible digital ureteroscopy was performed of the proximal ureter and systematic inspection of the kidney using very low pressure, minimal irrigation.  There was an additional stone in the most proximal ureter, this appeared to be quite small; however, it was appeared to be mineral base.  This was grasped with the Escape basket and brought out in its entirety, set aside for compositional analysis. Systematic inspection of the kidney with each calyx x2.  Again, taking great care to avoid excessive pressure was performed, no additional calcifications were found.  The ureteroscope and sheath were removed under continuous uteroscopic vision and no mucosal abnormalities were found.  There were some edema in the distal most ureter consistent with likely prior status in site of stone impaction.  It was felt that the stenting would be clearly warranted to prevent postop edema-based colic and as such, a new 5 x 26 Polaris-type stent was placed using very careful fluoroscopic guidance, good proximal deployment in the renal pelvis and distal in the urinary bladder.  Tether was left in place, fashioned to the thigh.  During all the ureteroscopic portions, an 8- [place] feeding tube was in the urinary bladder for pressure release. Procedure was then terminated.  The patient tolerated the procedure well.  There were no immediate periprocedural complications.  The patient was taken to postanesthesia care unit in stable condition with plan for confirming the fetal heart tones in the PACU and then transferring back to [facility] to the primary gynecologic  team.          ______________________________ Sebastian Ache, MD     TM/MEDQ  D:  05/28/2014  T:  05/29/2014  Job:  [id number]

## 2014-05-29 NOTE — Progress Notes (Signed)
Patient ID: Cheryl FujitaLauren Collins, female   DOB: 08/21/1990, 24 y.o.   MRN: [id number] Pt without complaints GFM Pain minimal after surgery yesterday  VSSAF FHR 145 no ctxs  Right flank minimal pain Abd Gravid nt  Right hydronephrosis and stone - s/p stone removal and stent placement Ok for dc per urology Pt to remove stent tomorrow and fu with urology in 2 weeks (per Urology) Complete macrobid  Course (currently on) Has Rx Percocet DL

## 2014-06-02 NOTE — Discharge Summary (Signed)
Obstetric Discharge Summary Reason for Admission: observation/evaluation Prenatal Procedures: ultrasound Intrapartum Procedures: na Postpartum Procedures: na Complications-Operative and Postpartum: na HEMOGLOBIN  Date Value Ref Range Status  05/27/2014 11.5* 12.0 - 15.0 g/dL Final   HCT  Date Value Ref Range Status  05/27/2014 34.0* 36.0 - 46.0 % Final    Physical Exam:  General: alert and cooperative Lochia: na Uterine Fundus: gravid Incision: na DVT Evaluation: No evidence of DVT seen on physical exam. Negative Homan's sign. No cords or calf tenderness.  Discharge Diagnoses: 23 week IUP with h/o hydronephrosis and renal calculi, s/p R ureteroscopy with basketing of stone  Discharge Information: Date: 06/02/2014 Activity: pelvic rest Diet: routine Medications: PNV, Percocet and macrobid Condition: improved Instructions: refer to practice specific booklet Discharge to: home Follow-up Information    Follow up with Sebastian AcheMANNY, THEODORE, MD.   Specialty:  Urology   Why:  We will call you soon to arrange f/u appt with MD visit   Contact information:   [address] [place] [address] [phone]       Newborn Data: This patient has no babies on file. Home with na.  Cheryl Collins G 06/02/2014, 8:30 AM

## 2014-09-18 ENCOUNTER — Telehealth (HOSPITAL_COMMUNITY): Payer: Self-pay | Admitting: *Deleted

## 2014-09-18 ENCOUNTER — Encounter (HOSPITAL_COMMUNITY): Payer: Self-pay | Admitting: *Deleted

## 2014-09-18 LAB — OB RESULTS CONSOLE GBS: STREP GROUP B AG: POSITIVE

## 2014-09-18 NOTE — Telephone Encounter (Signed)
Preadmission screen  

## 2014-09-24 ENCOUNTER — Encounter (HOSPITAL_COMMUNITY): Payer: Self-pay

## 2014-09-24 ENCOUNTER — Inpatient Hospital Stay (HOSPITAL_COMMUNITY)
Admission: RE | Admit: 2014-09-24 | Discharge: 2014-09-28 | DRG: 766 | Disposition: A | Discharge status: Home / Self Care | Payer: BLUE CROSS/BLUE SHIELD | Source: Ambulatory Visit | Attending: Obstetrics and Gynecology | Admitting: Obstetrics and Gynecology

## 2014-09-24 VITALS — BP 112/53 | HR 71 | Temp 98.2°F | Resp 22 | Ht 64.0 in | Wt 202.0 lb

## 2014-09-24 DIAGNOSIS — Z87442 Personal history of urinary calculi: Secondary | ICD-10-CM | POA: Diagnosis not present

## 2014-09-24 DIAGNOSIS — Z3A40 40 weeks gestation of pregnancy: Secondary | ICD-10-CM | POA: Diagnosis present

## 2014-09-24 DIAGNOSIS — O99824 Streptococcus B carrier state complicating childbirth: Secondary | ICD-10-CM | POA: Diagnosis present

## 2014-09-24 DIAGNOSIS — Z833 Family history of diabetes mellitus: Secondary | ICD-10-CM

## 2014-09-24 DIAGNOSIS — Z9889 Other specified postprocedural states: Secondary | ICD-10-CM

## 2014-09-24 DIAGNOSIS — Z98891 History of uterine scar from previous surgery: Secondary | ICD-10-CM

## 2014-09-24 DIAGNOSIS — O480 Post-term pregnancy: Secondary | ICD-10-CM | POA: Diagnosis present

## 2014-09-24 LAB — TYPE AND SCREEN
ABO/RH(D): O POS
Antibody Screen: NEGATIVE

## 2014-09-24 LAB — CBC
HCT: 32.9 % — ABNORMAL LOW (ref 36.0–46.0)
Hemoglobin: 11.3 g/dL — ABNORMAL LOW (ref 12.0–15.0)
MCH: 28.5 pg (ref 26.0–34.0)
MCHC: 34.3 g/dL (ref 30.0–36.0)
MCV: 82.9 fL (ref 78.0–100.0)
Platelets: 284 10*3/uL (ref 150–400)
RBC: 3.97 MIL/uL (ref 3.87–5.11)
RDW: 13.7 % (ref 11.5–15.5)
WBC: 11.9 10*3/uL — AB (ref 4.0–10.5)

## 2014-09-24 MED ORDER — MISOPROSTOL 25 MCG QUARTER TABLET
25.0000 ug | ORAL_TABLET | ORAL | Status: DC | PRN
  Administered 2014-09-24 – 2014-09-25 (×2): 25 ug via VAGINAL
  Filled 2014-09-24: qty 0.25
  Filled 2014-09-24: qty 1
  Filled 2014-09-24: qty 0.25

## 2014-09-24 MED ORDER — ONDANSETRON HCL 4 MG/2ML IJ SOLN
4.0000 mg | Freq: Four times a day (QID) | INTRAMUSCULAR | Status: DC | PRN
  Administered 2014-09-26 (×2): 4 mg via INTRAVENOUS
  Filled 2014-09-24: qty 2

## 2014-09-24 MED ORDER — ZOLPIDEM TARTRATE 5 MG PO TABS
5.0000 mg | ORAL_TABLET | Freq: Every evening | ORAL | Status: DC | PRN
  Administered 2014-09-24: 5 mg via ORAL
  Filled 2014-09-24: qty 1

## 2014-09-24 MED ORDER — ACETAMINOPHEN 325 MG PO TABS: 650.0000 mg | ORAL_TABLET | ORAL | Status: DC | PRN

## 2014-09-24 MED ORDER — OXYTOCIN 40 UNITS IN LACTATED RINGERS INFUSION - SIMPLE MED
62.5000 mL/h | INTRAVENOUS | Status: DC
  Filled 2014-09-24: qty 1000

## 2014-09-24 MED ORDER — OXYCODONE-ACETAMINOPHEN 5-325 MG PO TABS: 2.0000 | ORAL_TABLET | ORAL | Status: DC | PRN

## 2014-09-24 MED ORDER — LACTATED RINGERS IV SOLN
INTRAVENOUS | Status: DC
  Administered 2014-09-24: 21:00:00 via INTRAVENOUS
  Administered 2014-09-25: 1000 mL via INTRAVENOUS
  Administered 2014-09-25 – 2014-09-26 (×3): via INTRAVENOUS

## 2014-09-24 MED ORDER — FLEET ENEMA 7-19 GM/118ML RE ENEM: 1.0000 | ENEMA | Freq: Once | RECTAL | Status: DC

## 2014-09-24 MED ORDER — LACTATED RINGERS IV SOLN
500.0000 mL | INTRAVENOUS | Status: DC | PRN
  Administered 2014-09-25: 1000 mL via INTRAVENOUS
  Administered 2014-09-26: 500 mL via INTRAVENOUS

## 2014-09-24 MED ORDER — OXYCODONE-ACETAMINOPHEN 5-325 MG PO TABS: 1.0000 | ORAL_TABLET | ORAL | Status: DC | PRN

## 2014-09-24 MED ORDER — LIDOCAINE HCL (PF) 1 % IJ SOLN: 30.0000 mL | INTRAMUSCULAR | Status: DC | PRN

## 2014-09-24 MED ORDER — TERBUTALINE SULFATE 1 MG/ML IJ SOLN: 0.2500 mg | Freq: Once | INTRAMUSCULAR | Status: AC | PRN

## 2014-09-24 MED ORDER — CITRIC ACID-SODIUM CITRATE 334-500 MG/5ML PO SOLN
30.0000 mL | ORAL | Status: DC | PRN
  Administered 2014-09-25 – 2014-09-26 (×2): 30 mL via ORAL
  Filled 2014-09-24 (×2): qty 15

## 2014-09-24 MED ORDER — OXYTOCIN BOLUS FROM INFUSION: 500.0000 mL | INTRAVENOUS | Status: DC

## 2014-09-25 ENCOUNTER — Inpatient Hospital Stay (HOSPITAL_COMMUNITY): Payer: BLUE CROSS/BLUE SHIELD | Admitting: Anesthesiology

## 2014-09-25 LAB — RPR: RPR: NONREACTIVE

## 2014-09-25 LAB — ABO/RH: ABO/RH(D): O POS

## 2014-09-25 MED ORDER — DIPHENHYDRAMINE HCL 50 MG/ML IJ SOLN: 12.5000 mg | INTRAMUSCULAR | Status: DC | PRN

## 2014-09-25 MED ORDER — LIDOCAINE HCL (PF) 1 % IJ SOLN
INTRAMUSCULAR | Status: DC | PRN
  Administered 2014-09-25: 10 mL

## 2014-09-25 MED ORDER — FENTANYL 2.5 MCG/ML BUPIVACAINE 1/10 % EPIDURAL INFUSION (WH - ANES): 14.0000 mL/h | INTRAMUSCULAR | Status: DC | PRN

## 2014-09-25 MED ORDER — FENTANYL 2.5 MCG/ML BUPIVACAINE 1/10 % EPIDURAL INFUSION (WH - ANES)
14.0000 mL/h | INTRAMUSCULAR | Status: DC | PRN
  Administered 2014-09-25 (×2): 14 mL/h via EPIDURAL
  Filled 2014-09-25 (×2): qty 125

## 2014-09-25 MED ORDER — EPHEDRINE 5 MG/ML INJ: 10.0000 mg | INTRAVENOUS | Status: DC | PRN

## 2014-09-25 MED ORDER — BUTORPHANOL TARTRATE 1 MG/ML IJ SOLN
1.0000 mg | INTRAMUSCULAR | Status: DC | PRN
  Administered 2014-09-25: 1 mg via INTRAVENOUS
  Filled 2014-09-25 (×2): qty 1

## 2014-09-25 MED ORDER — PHENYLEPHRINE 40 MCG/ML (10ML) SYRINGE FOR IV PUSH (FOR BLOOD PRESSURE SUPPORT)
80.0000 ug | PREFILLED_SYRINGE | INTRAVENOUS | Status: DC | PRN
  Filled 2014-09-25: qty 20

## 2014-09-25 MED ORDER — VANCOMYCIN HCL IN DEXTROSE 1-5 GM/200ML-% IV SOLN
1000.0000 mg | Freq: Two times a day (BID) | INTRAVENOUS | Status: DC
  Administered 2014-09-25 (×2): 1000 mg via INTRAVENOUS
  Filled 2014-09-25 (×3): qty 200

## 2014-09-25 MED ORDER — TERBUTALINE SULFATE 1 MG/ML IJ SOLN: 0.2500 mg | Freq: Once | INTRAMUSCULAR | Status: AC | PRN

## 2014-09-25 MED ORDER — OXYTOCIN 40 UNITS IN LACTATED RINGERS INFUSION - SIMPLE MED
1.0000 m[IU]/min | INTRAVENOUS | Status: DC
  Administered 2014-09-25: 2 m[IU]/min via INTRAVENOUS

## 2014-09-25 NOTE — Progress Notes (Signed)
Pt getting comfortable w/ epidural  FHT cat 1 Toco Q2-4 Cvx 4/90/-1 IUPC placed  A/P:  Continue pitocin

## 2014-09-25 NOTE — Anesthesia Procedure Notes (Signed)
Epidural Patient location during procedure: OB Start time: 09/25/2014 2:59 PM End time: 09/25/2014 3:38 PM  Staffing Anesthesiologist: Sebastian AcheMANNY, Jaheem Hedgepath Performed by: anesthesiologist   Preanesthetic Checklist Completed: patient identified, site marked, surgical consent, pre-op evaluation, timeout performed, IV checked, risks and benefits discussed and monitors and equipment checked  Epidural Patient position: sitting Prep: site prepped and draped and DuraPrep Patient monitoring: heart rate, continuous pulse ox and blood pressure Approach: midline Location: L3-L4 Injection technique: LOR air  Needle:  Needle type: Tuohy  Needle gauge: 17 G Needle length: 9 cm and 9 Needle insertion depth: 6 cm Catheter type: closed end flexible Catheter size: 19 Gauge Catheter at skin depth: 14 cm Test dose: negative  Assessment Events: blood not aspirated, injection not painful, no injection resistance, negative IV test and no paresthesia  Additional Notes   Patient tolerated the insertion well without complications.Reason for block:procedure for pain

## 2014-09-25 NOTE — H&P (Signed)
Cheryl Collins is a 24 y.o. female presenting for postdates IOL.  Pt was given cytotec overnight.  Pregnancy uncomplicated.  History OB History    Gravida Para Term Preterm AB TAB SAB Ectopic Multiple Living   1              Past Medical History  Diagnosis Date  . Vaginal Pap smear, abnormal   . Hx of varicella   . Kidney stone    Past Surgical History  Procedure Laterality Date  . Tonsillectomy    . Cystoscopy w/ retrogrades Right 05/28/2014    Procedure: CYSTOSCOPY WITH RETROGRADE PYELOGRAM;  Surgeon: Cheryl Acheheodore Manny, MD;  Location: [facility];  Service: Urology;  Laterality: Right;  . Ureteroscopy Right 05/28/2014    Procedure: URETEROSCOPY/STONE BASKET EXTRACTION;  Surgeon: Cheryl Acheheodore Manny, MD;  Location: [facility];  Service: Urology;  Laterality: Right;  . Cystoscopy with stent placement Right 05/28/2014    Procedure: CYSTOSCOPY WITH STENT PLACEMENT;  Surgeon: Cheryl Acheheodore Manny, MD;  Location: [facility];  Service: Urology;  Laterality: Right;  . Colposcopy    . Ear drum reconstruction    . Pilonidal cyst excision    . Throat cystectomy     Family History: family history includes Alcohol abuse in her paternal grandfather; Cancer in her paternal grandfather; Diabetes in her maternal aunt, maternal grandmother, and paternal grandmother. Social History:  reports that she has never smoked. She does not have any smokeless tobacco history on file. She reports that she does not drink alcohol or use illicit drugs.   Prenatal Transfer Tool  Maternal Diabetes: No Genetic Screening: Normal Maternal Ultrasounds/Referrals: Normal Fetal Ultrasounds or other Referrals:  None Maternal Substance Abuse:  No Significant Maternal Medications:  None Significant Maternal Lab Results:  None Other Comments:  None  ROS  Dilation: 1 Effacement (%): 50 Station: -3 Exam by:: Cheryl Collins Blood pressure 125/58, pulse 71, temperature 98 F (36.7 C), temperature source Oral, resp. rate 20, height 5\' 4"  (1.626 m), weight  202 lb (91.627 kg). Exam Physical Exam  Gen - NAD Abd - gravid, NT Ext - NT, trace edema bilaterally Cvx 1/50/-2 ROM @ time of exam - clear  Prenatal labs: ABO, Rh: --/--/O POS, O POS (05/04 2102) Antibody: NEG (05/04 2102) Rubella: Immune (09/29 0000) RPR: Nonreactive (09/29 0000)  HBsAg: Negative (09/29 0000)  HIV: Non-reactive (09/29 0000)  GBS: Positive (04/28 0000)   Assessment/Plan: Postdates IOL Will start pitocin Vancomycin for GBS Epidural/IV meds prn   Cheryl Collins 09/25/2014, 9:12 AM

## 2014-09-25 NOTE — Anesthesia Preprocedure Evaluation (Addendum)
Anesthesia Evaluation  Patient identified by MRN, date of birth, ID band Patient awake and Patient confused    Reviewed: Allergy & Precautions, H&P , NPO status , Patient's Chart, lab work & pertinent test results  Airway Mallampati: II       Dental  (+) Teeth Intact   Pulmonary  breath sounds clear to auscultation  Pulmonary exam normal       Cardiovascular Exercise Tolerance: Good Normal cardiovascular examRhythm:regular Rate:Normal     Neuro/Psych    GI/Hepatic   Endo/Other    Renal/GU stones     Musculoskeletal   Abdominal   Peds  Hematology   Anesthesia Other Findings   Reproductive/Obstetrics (+) Pregnancy                             Anesthesia Physical Anesthesia Plan  ASA: II  Anesthesia Plan: Epidural   Post-op Pain Management:    Induction:   Airway Management Planned:   Additional Equipment:   Intra-op Plan:   Post-operative Plan:   Informed Consent: I have reviewed the patients History and Physical, chart, labs and discussed the procedure including the risks, benefits and alternatives for the proposed anesthesia with the patient or authorized representative who has indicated his/her understanding and acceptance.     Plan Discussed with:   Anesthesia Plan Comments: (For C/S with epidural in -situ)       Anesthesia Quick Evaluation

## 2014-09-26 ENCOUNTER — Encounter (HOSPITAL_COMMUNITY)
Admission: RE | Disposition: A | Discharge status: Home / Self Care | Payer: Self-pay | Source: Ambulatory Visit | Attending: Obstetrics and Gynecology

## 2014-09-26 ENCOUNTER — Encounter (HOSPITAL_COMMUNITY): Payer: Self-pay

## 2014-09-26 DIAGNOSIS — Z98891 History of uterine scar from previous surgery: Secondary | ICD-10-CM

## 2014-09-26 SURGERY — Surgical Case
Anesthesia: Epidural

## 2014-09-26 MED ORDER — SENNOSIDES-DOCUSATE SODIUM 8.6-50 MG PO TABS
2.0000 | ORAL_TABLET | ORAL | Status: DC
  Administered 2014-09-27 (×2): 2 via ORAL
  Filled 2014-09-26 (×2): qty 2

## 2014-09-26 MED ORDER — SIMETHICONE 80 MG PO CHEW: 80.0000 mg | CHEWABLE_TABLET | ORAL | Status: DC | PRN

## 2014-09-26 MED ORDER — SODIUM BICARBONATE 8.4 % IV SOLN
INTRAVENOUS | Status: DC | PRN
  Administered 2014-09-26 (×4): 5 mL via EPIDURAL

## 2014-09-26 MED ORDER — PRENATAL MULTIVITAMIN CH
1.0000 | ORAL_TABLET | Freq: Every day | ORAL | Status: DC
  Administered 2014-09-26 – 2014-09-27 (×2): 1 via ORAL
  Filled 2014-09-26 (×2): qty 1

## 2014-09-26 MED ORDER — DIPHENHYDRAMINE HCL 25 MG PO CAPS
25.0000 mg | ORAL_CAPSULE | Freq: Four times a day (QID) | ORAL | Status: DC | PRN
  Administered 2014-09-26: 25 mg via ORAL
  Filled 2014-09-26: qty 1

## 2014-09-26 MED ORDER — IBUPROFEN 600 MG PO TABS
600.0000 mg | ORAL_TABLET | Freq: Four times a day (QID) | ORAL | Status: DC
  Administered 2014-09-26 – 2014-09-28 (×7): 600 mg via ORAL
  Filled 2014-09-26 (×7): qty 1

## 2014-09-26 MED ORDER — PHENYLEPHRINE 40 MCG/ML (10ML) SYRINGE FOR IV PUSH (FOR BLOOD PRESSURE SUPPORT)
PREFILLED_SYRINGE | INTRAVENOUS | Status: AC
  Filled 2014-09-26: qty 10

## 2014-09-26 MED ORDER — DEXTROSE IN LACTATED RINGERS 5 % IV SOLN
INTRAVENOUS | Status: DC
  Administered 2014-09-26 (×2): via INTRAVENOUS

## 2014-09-26 MED ORDER — OXYCODONE-ACETAMINOPHEN 5-325 MG PO TABS
1.0000 | ORAL_TABLET | ORAL | Status: DC | PRN
Start: 2014-09-26 — End: 2014-09-28
  Administered 2014-09-27 (×2): 1 via ORAL
  Filled 2014-09-26 (×2): qty 1

## 2014-09-26 MED ORDER — SIMETHICONE 80 MG PO CHEW
80.0000 mg | CHEWABLE_TABLET | ORAL | Status: DC
  Administered 2014-09-27 (×2): 80 mg via ORAL
  Filled 2014-09-26 (×2): qty 1

## 2014-09-26 MED ORDER — MORPHINE SULFATE (PF) 0.5 MG/ML IJ SOLN
INTRAMUSCULAR | Status: DC | PRN
  Administered 2014-09-26: 4 mg via EPIDURAL

## 2014-09-26 MED ORDER — SCOPOLAMINE 1 MG/3DAYS TD PT72
MEDICATED_PATCH | TRANSDERMAL | Status: AC
  Filled 2014-09-26: qty 1

## 2014-09-26 MED ORDER — OXYCODONE-ACETAMINOPHEN 5-325 MG PO TABS
2.0000 | ORAL_TABLET | ORAL | Status: DC | PRN
  Administered 2014-09-28: 2 via ORAL
  Filled 2014-09-26: qty 2

## 2014-09-26 MED ORDER — ACETAMINOPHEN 325 MG PO TABS: 650.0000 mg | ORAL_TABLET | ORAL | Status: DC | PRN

## 2014-09-26 MED ORDER — LACTATED RINGERS IV SOLN
INTRAVENOUS | Status: DC | PRN
  Administered 2014-09-26: 04:00:00 via INTRAVENOUS

## 2014-09-26 MED ORDER — SIMETHICONE 80 MG PO CHEW
80.0000 mg | CHEWABLE_TABLET | Freq: Three times a day (TID) | ORAL | Status: DC
  Administered 2014-09-26 – 2014-09-28 (×6): 80 mg via ORAL
  Filled 2014-09-26 (×6): qty 1

## 2014-09-26 MED ORDER — HYDROMORPHONE HCL 1 MG/ML IJ SOLN: 0.2500 mg | INTRAMUSCULAR | Status: DC | PRN

## 2014-09-26 MED ORDER — ZOLPIDEM TARTRATE 5 MG PO TABS: 5.0000 mg | ORAL_TABLET | Freq: Every evening | ORAL | Status: DC | PRN

## 2014-09-26 MED ORDER — MEASLES, MUMPS & RUBELLA VAC ~~LOC~~ INJ: 0.5000 mL | INJECTION | Freq: Once | SUBCUTANEOUS | Status: DC

## 2014-09-26 MED ORDER — DIPHENHYDRAMINE HCL 50 MG/ML IJ SOLN: 12.5000 mg | INTRAMUSCULAR | Status: DC | PRN

## 2014-09-26 MED ORDER — LIDOCAINE-EPINEPHRINE (PF) 2 %-1:200000 IJ SOLN
INTRAMUSCULAR | Status: AC
  Filled 2014-09-26: qty 20

## 2014-09-26 MED ORDER — DEXAMETHASONE SODIUM PHOSPHATE 4 MG/ML IJ SOLN
INTRAMUSCULAR | Status: AC
  Filled 2014-09-26: qty 1

## 2014-09-26 MED ORDER — SCOPOLAMINE 1 MG/3DAYS TD PT72
1.0000 | MEDICATED_PATCH | Freq: Once | TRANSDERMAL | Status: DC
  Filled 2014-09-26: qty 1

## 2014-09-26 MED ORDER — NALOXONE HCL 0.4 MG/ML IJ SOLN: 0.4000 mg | INTRAMUSCULAR | Status: DC | PRN

## 2014-09-26 MED ORDER — OXYTOCIN 10 UNIT/ML IJ SOLN
INTRAMUSCULAR | Status: AC
  Filled 2014-09-26: qty 4

## 2014-09-26 MED ORDER — NALBUPHINE HCL 10 MG/ML IJ SOLN: 5.0000 mg | Freq: Once | INTRAMUSCULAR | Status: AC | PRN

## 2014-09-26 MED ORDER — KETOROLAC TROMETHAMINE 30 MG/ML IJ SOLN
30.0000 mg | Freq: Four times a day (QID) | INTRAMUSCULAR | Status: AC | PRN
  Administered 2014-09-26: 30 mg via INTRAMUSCULAR

## 2014-09-26 MED ORDER — IBUPROFEN 600 MG PO TABS: 600.0000 mg | ORAL_TABLET | Freq: Four times a day (QID) | ORAL | Status: DC | PRN

## 2014-09-26 MED ORDER — MEPERIDINE HCL 25 MG/ML IJ SOLN: 6.2500 mg | INTRAMUSCULAR | Status: DC | PRN

## 2014-09-26 MED ORDER — NALOXONE HCL 1 MG/ML IJ SOLN
1.0000 ug/kg/h | INTRAVENOUS | Status: DC | PRN
  Filled 2014-09-26: qty 2

## 2014-09-26 MED ORDER — OXYTOCIN 10 UNIT/ML IJ SOLN
40.0000 [IU] | INTRAVENOUS | Status: DC | PRN
  Administered 2014-09-26: 40 [IU] via INTRAVENOUS

## 2014-09-26 MED ORDER — MORPHINE SULFATE 0.5 MG/ML IJ SOLN
INTRAMUSCULAR | Status: AC
  Filled 2014-09-26: qty 10

## 2014-09-26 MED ORDER — DIBUCAINE 1 % RE OINT: 1.0000 "application " | TOPICAL_OINTMENT | RECTAL | Status: DC | PRN

## 2014-09-26 MED ORDER — LANOLIN HYDROUS EX OINT: 1.0000 "application " | TOPICAL_OINTMENT | CUTANEOUS | Status: DC | PRN

## 2014-09-26 MED ORDER — OXYTOCIN 40 UNITS IN LACTATED RINGERS INFUSION - SIMPLE MED: 62.5000 mL/h | INTRAVENOUS | Status: AC

## 2014-09-26 MED ORDER — WITCH HAZEL-GLYCERIN EX PADS: 1.0000 "application " | MEDICATED_PAD | CUTANEOUS | Status: DC | PRN

## 2014-09-26 MED ORDER — CLINDAMYCIN PHOSPHATE 900 MG/50ML IV SOLN
900.0000 mg | Freq: Once | INTRAVENOUS | Status: AC
  Administered 2014-09-26: 900 mg via INTRAVENOUS
  Filled 2014-09-26: qty 50

## 2014-09-26 MED ORDER — KETOROLAC TROMETHAMINE 30 MG/ML IJ SOLN: 30.0000 mg | Freq: Once | INTRAMUSCULAR | Status: AC | PRN

## 2014-09-26 MED ORDER — ONDANSETRON HCL 4 MG/2ML IJ SOLN: 4.0000 mg | Freq: Three times a day (TID) | INTRAMUSCULAR | Status: DC | PRN

## 2014-09-26 MED ORDER — PROMETHAZINE HCL 25 MG/ML IJ SOLN: 6.2500 mg | INTRAMUSCULAR | Status: DC | PRN

## 2014-09-26 MED ORDER — DIPHENHYDRAMINE HCL 25 MG PO CAPS
25.0000 mg | ORAL_CAPSULE | ORAL | Status: DC | PRN
  Administered 2014-09-26 – 2014-09-27 (×3): 25 mg via ORAL
  Filled 2014-09-26 (×3): qty 1

## 2014-09-26 MED ORDER — TETANUS-DIPHTH-ACELL PERTUSSIS 5-2.5-18.5 LF-MCG/0.5 IM SUSP: 0.5000 mL | Freq: Once | INTRAMUSCULAR | Status: DC

## 2014-09-26 MED ORDER — NALBUPHINE HCL 10 MG/ML IJ SOLN
5.0000 mg | INTRAMUSCULAR | Status: DC | PRN
  Administered 2014-09-26: 5 mg via INTRAVENOUS
  Filled 2014-09-26 (×2): qty 1

## 2014-09-26 MED ORDER — SCOPOLAMINE 1 MG/3DAYS TD PT72
MEDICATED_PATCH | TRANSDERMAL | Status: DC | PRN
  Administered 2014-09-26: 1 via TRANSDERMAL

## 2014-09-26 MED ORDER — MEDROXYPROGESTERONE ACETATE 150 MG/ML IM SUSP: 150.0000 mg | INTRAMUSCULAR | Status: DC | PRN

## 2014-09-26 MED ORDER — KETOROLAC TROMETHAMINE 30 MG/ML IJ SOLN
INTRAMUSCULAR | Status: AC
  Filled 2014-09-26: qty 1

## 2014-09-26 MED ORDER — DEXAMETHASONE SODIUM PHOSPHATE 4 MG/ML IJ SOLN
INTRAMUSCULAR | Status: DC | PRN
  Administered 2014-09-26: 4 mg via INTRAVENOUS

## 2014-09-26 MED ORDER — MENTHOL 3 MG MT LOZG: 1.0000 | LOZENGE | OROMUCOSAL | Status: DC | PRN

## 2014-09-26 MED ORDER — SODIUM CHLORIDE 0.9 % IJ SOLN: 3.0000 mL | INTRAMUSCULAR | Status: DC | PRN

## 2014-09-26 MED ORDER — NALBUPHINE HCL 10 MG/ML IJ SOLN: 5.0000 mg | INTRAMUSCULAR | Status: DC | PRN

## 2014-09-26 MED ORDER — SODIUM BICARBONATE 8.4 % IV SOLN
INTRAVENOUS | Status: AC
  Filled 2014-09-26: qty 50

## 2014-09-26 MED ORDER — SODIUM CHLORIDE 0.9 % IR SOLN
Status: DC | PRN
  Administered 2014-09-26: 1000 mL

## 2014-09-26 MED ORDER — KETOROLAC TROMETHAMINE 30 MG/ML IJ SOLN: 30.0000 mg | Freq: Four times a day (QID) | INTRAMUSCULAR | Status: AC | PRN

## 2014-09-26 MED ORDER — ONDANSETRON HCL 4 MG/2ML IJ SOLN
INTRAMUSCULAR | Status: AC
  Filled 2014-09-26: qty 2

## 2014-09-26 SURGICAL SUPPLY — 27 items
CLAMP CORD UMBIL (MISCELLANEOUS) IMPLANT
CLOTH BEACON ORANGE TIMEOUT ST (SAFETY) ×2 IMPLANT
DRAPE SHEET LG 3/4 BI-LAMINATE (DRAPES) IMPLANT
DRSG OPSITE POSTOP 4X10 (GAUZE/BANDAGES/DRESSINGS) ×2 IMPLANT
DURAPREP 26ML APPLICATOR (WOUND CARE) ×2 IMPLANT
ELECT REM PT RETURN 9FT ADLT (ELECTROSURGICAL) ×2
ELECTRODE REM PT RTRN 9FT ADLT (ELECTROSURGICAL) ×1 IMPLANT
EXTRACTOR VACUUM M CUP 4 TUBE (SUCTIONS) IMPLANT
GLOVE BIO SURGEON STRL SZ 6.5 (GLOVE) ×2 IMPLANT
GLOVE BIOGEL PI IND STRL 7.0 (GLOVE) ×1 IMPLANT
GLOVE BIOGEL PI INDICATOR 7.0 (GLOVE) ×1
GOWN STRL REUS W/TWL LRG LVL3 (GOWN DISPOSABLE) ×4 IMPLANT
KIT ABG SYR 3ML LUER SLIP (SYRINGE) IMPLANT
LIQUID BAND (GAUZE/BANDAGES/DRESSINGS) ×2 IMPLANT
NEEDLE HYPO 25X5/8 SAFETYGLIDE (NEEDLE) IMPLANT
NS IRRIG 1000ML POUR BTL (IV SOLUTION) ×2 IMPLANT
PACK C SECTION WH (CUSTOM PROCEDURE TRAY) ×2 IMPLANT
PAD OB MATERNITY 4.3X12.25 (PERSONAL CARE ITEMS) ×2 IMPLANT
STAPLER VISISTAT 35W (STAPLE) ×2 IMPLANT
SUT CHROMIC 0 CT 802H (SUTURE) ×2 IMPLANT
SUT CHROMIC 0 CTX 36 (SUTURE) ×6 IMPLANT
SUT MON AB-0 CT1 36 (SUTURE) ×2 IMPLANT
SUT PDS AB 0 CTX 60 (SUTURE) ×2 IMPLANT
SUT PLAIN 0 NONE (SUTURE) IMPLANT
SUT VIC AB 4-0 KS 27 (SUTURE) IMPLANT
TOWEL OR 17X24 6PK STRL BLUE (TOWEL DISPOSABLE) ×2 IMPLANT
TRAY FOLEY CATH SILVER 14FR (SET/KITS/TRAYS/PACK) IMPLANT

## 2014-09-26 NOTE — Progress Notes (Signed)
Pt pushing now x 2 hours.  Good progress with first hour but no change of station during second hour.  Pt is exhausted and nauseated - requesting c-section.  Family and I agree.  Zofran given IV.  R/b/a of primary c-section discussed, questions answered, informed consent OR called

## 2014-09-26 NOTE — Op Note (Signed)
Cesarean Section Procedure Note   Cheryl FujitaLauren Trego  09/24/2014 - 09/26/2014  Indications: Scheduled Proceedure/Maternal Request   Pre-operative Diagnosis: primary cesarean section arrest of descent.   Post-operative Diagnosis: Same   Surgeon: Surgeon(s) and Role:    * Zelphia CairoGretchen Rashay Barnette, MD - Primary   Assistants: none  Anesthesia: epidural, spinal   Procedure Details:  The patient was seen in the Holding Room. The risks, benefits, complications, treatment options, and expected outcomes were discussed with the patient. The patient concurred with the proposed plan, giving informed consent. identified as Cheryl Collins and the procedure verified as C-Section Delivery. A Time Out was held and the above information confirmed.  After induction of anesthesia, the patient was draped and prepped in the usual sterile manner. A transverse was made and carried down through the subcutaneous tissue to the fascia. Fascial incision was made and extended transversely. The fascia was separated from the underlying rectus tissue superiorly and inferiorly. The peritoneum was identified and entered. Peritoneal incision was extended longitudinally. The utero-vesical peritoneal reflection was incised transversely and the bladder flap was bluntly freed from the lower uterine segment. A low transverse uterine incision was made. Delivered from cephalic presentation was a vigorous female with Apgar scores of 8 at one minute and 9 at five minutes. Cord ph was not sent the umbilical cord was clamped and cut cord blood was obtained for evaluation. The placenta was removed Intact and appeared normal. The uterine outline, tubes and ovaries appeared normal}. The uterine incision was closed with running locked sutures of 0chromic gut.   Hemostasis was observed. Lavage was carried out until clear.  Peritoneum was closed with 0 monocryl.  The fascia was then reapproximated with running sutures of 0PDSl.  The skin was closed with staples.    Instrument, sponge, and needle counts were correct prior the abdominal closure and were correct at the conclusion of the case.     Estimated Blood Loss: 700cc   Urine Output: clear  Specimens: none   Complications: no complications  Disposition: PACU - hemodynamically stable.   Maternal Condition: stable   Baby condition / location:  Couplet care / Skin to Skin  Attending Attestation: I was present and scrubbed for the entire procedure.   Signed: Surgeon(s): Zelphia CairoGretchen Thena Devora, MD

## 2014-09-26 NOTE — Progress Notes (Signed)
Patient resting. BP 118/53 mmHg  Pulse 72  Temp(Src) 99 F (37.2 C) (Oral)  Resp 18  Ht 5\' 4"  (1.626 m)  Wt 91.627 kg (202 lb)  BMI 34.66 kg/m2  SpO2 98%  Breastfeeding? Unknown No results found for this or any previous visit (from the past 24 hour(s)). Abdomen is soft and non tender  IMPRESSION: POD#0 Doing well Routine care Desires circ - consented

## 2014-09-26 NOTE — Anesthesia Postprocedure Evaluation (Signed)
Anesthesia Post Note  Patient: Cheryl FujitaLauren Collins  Procedure(s) Performed: Procedure(s) (LRB): CESAREAN SECTION (N/A)  Anesthesia type: Spinal  Patient location: PACU  Post pain: Pain level controlled  Post assessment: Post-op Vital signs reviewed  Last Vitals:  Filed Vitals:   09/26/14 0645  BP: 113/49  Pulse: 92  Temp: 36.7 C  Resp: 18    Post vital signs: Reviewed  Level of consciousness: awake  Complications: No apparent anesthesia complications

## 2014-09-26 NOTE — Progress Notes (Signed)
Pt comfortable w/ epidural  FHT cat 1 Toco Q2-3 Cvx c/c/+2  A/P:  Will start pushing

## 2014-09-26 NOTE — Transfer of Care (Signed)
Immediate Anesthesia Transfer of Care Note  Patient: Cheryl Collins  Procedure(s) Performed: Procedure(s): CESAREAN SECTION (N/A)  Patient Location: PACU  Anesthesia Type:Epidural  Level of Consciousness: awake, alert  and oriented  Airway & Oxygen Therapy: Patient Spontanous Breathing  Post-op Assessment: Report given to RN and Post -op Vital signs reviewed and stable  Post vital signs: Reviewed and stable  Last Vitals:  Filed Vitals:   09/25/14 2033  BP:   Pulse:   Temp: 36.7 C  Resp: 20    Complications: No apparent anesthesia complications

## 2014-09-26 NOTE — Anesthesia Postprocedure Evaluation (Signed)
  Anesthesia Post-op Note  Patient: Cheryl FujitaLauren Hinderliter  Procedure(s) Performed: Procedure(s): CESAREAN SECTION (N/A)  Patient Location: Mother/Baby  Anesthesia Type:Epidural  Level of Consciousness: awake and alert   Airway and Oxygen Therapy: Patient Spontanous Breathing  Post-op Pain: mild  Post-op Assessment: Post-op Vital signs reviewed, Patient's Cardiovascular Status Stable, RESPIRATORY FUNCTION UNSTABLE, No signs of Nausea or vomiting, Adequate PO intake, No headache and No residual motor weakness  Post-op Vital Signs: Reviewed  Last Vitals:  Filed Vitals:   09/26/14 1030  BP: 110/56  Pulse: 62  Temp: 37 C  Resp: 20    Complications: No apparent anesthesia complications

## 2014-09-26 NOTE — Lactation Note (Addendum)
This note was copied from the chart of Boy Paulita FujitaLauren Vanhecke. Lactation Consultation Note  Patient Name: Boy Paulita FujitaLauren Paras Today's Date: 09/26/2014 Reason for consult: Initial assessment Baby 9 hours of life, has been to breast 4 times. Mom holding baby STS when LC entered room, baby not cueing to nurse. Mom reports that baby is sleepy at breast. Discussed normal newborn behavior with parents, and enc lots of STS and attempts at breast. Enc to nurse with cues and at least 8-12 times/24 hours. Mom states that she is able to hand express colostrum from both breast. Enc mom to alternate which breast she starts with at each BF. [facility], aware of OP/BFSG, community resources, and [facility] after [facility]. Enc mom to call out for assistance with latching/nursing as needed.   Maternal Data Has patient been taught Hand Expression?: Yes (Per mom.) Does the patient have breastfeeding experience prior to this delivery?: Yes  Feeding Feeding Type: Breast Fed  LATCH Score/Interventions                      Lactation Tools Discussed/Used     Consult Status Consult Status: Follow-up Date: 09/27/14 Follow-up type: In-patient    Geralynn OchsWILLIARD, Arita Severtson 09/26/2014, 2:06 PM

## 2014-09-26 NOTE — Addendum Note (Signed)
Addendum  created 09/26/14 1424 by Angela Adamana G Estanislado Surgeon, CRNA   Modules edited: Notes Section   Notes Section:  File: [id number]

## 2014-09-27 LAB — BIRTH TISSUE RECOVERY COLLECTION (PLACENTA DONATION)

## 2014-09-27 NOTE — Progress Notes (Signed)
S:  Patient doing well. O;  BP 108/44 mmHg  Pulse 60  Temp(Src) 98.5 F (36.9 C) (Oral)  Resp 16  Ht 5\' 4"  (1.626 m)  Wt 91.627 kg (202 lb)  BMI 34.66 kg/m2  SpO2 99%  Breastfeeding? Unknown Results for orders placed or performed during the hospital encounter of 09/24/14 (from the past 24 hour(s))  Collect bld for placenta donatation     Status: None   Collection Time: 09/27/14  5:55 AM  Result Value Ref Range   Placenta donation bld collect COLLECTED BY LABORATORY    Abdomen is soft and non tender Incision clean and dry  IMPRESSION: POD #1 Doing well PLAN: Routine care

## 2014-09-28 MED ORDER — OXYCODONE-ACETAMINOPHEN 5-325 MG PO TABS: 1.0000 | ORAL_TABLET | ORAL | Status: DC | PRN

## 2014-09-28 MED ORDER — IBUPROFEN 600 MG PO TABS: 600.0000 mg | ORAL_TABLET | Freq: Four times a day (QID) | ORAL | Status: DC

## 2014-09-28 NOTE — Discharge Summary (Signed)
Obstetric Discharge Summary Reason for Admission: induction of labor Prenatal Procedures: none Intrapartum Procedures: cesarean: low cervical, transverse Postpartum Procedures: none Complications-Operative and Postpartum: none HEMOGLOBIN  Date Value Ref Range Status  09/24/2014 11.3* 12.0 - 15.0 g/dL Final   HCT  Date Value Ref Range Status  09/24/2014 32.9* 36.0 - 46.0 % Final    Physical Exam:  General: alert, cooperative and appears stated age 81Lochia: appropriate Uterine Fundus: firm Incision: healing well DVT Evaluation: No evidence of DVT seen on physical exam.  Discharge Diagnoses: Term Pregnancy-delivered  Discharge Information: Date: 09/28/2014 Activity: pelvic rest Diet: routine Medications: Ibuprofen and Percocet Condition: stable Instructions: refer to practice specific booklet Discharge to: home   Newborn Data: Live born female  Birth Weight: 8 lb (3630 g) APGAR: 8, 9  Home with mother.  Cniyah Sproull L 09/28/2014, 9:02 AM

## 2014-09-28 NOTE — Lactation Note (Signed)
This note was copied from the chart of Cheryl Paulita FujitaLauren Kennard. Lactation Consultation Note: Follow up visit with mom before DC. She has baby latched to breast when I went into room. Mom reports nipples are getting tender- pink but intact. Comfort gels given with instructions. Has lanolin but encouraged to rub EBM into nipples after nursing instead. Reviewed BFSG and OP appointments as resources for support after DC,. No further questions at present To call prn  Patient Name: Cheryl Collins ONGEX'BToday's Date: 09/28/2014 Reason for consult: Follow-up assessment   Maternal Data Formula Feeding for Exclusion: No Has patient been taught Hand Expression?: Yes Does the patient have breastfeeding experience prior to this delivery?: No  Feeding Feeding Type: Breast Fed Length of feed: 45 min  LATCH Score/Interventions Latch: Grasps breast easily, tongue down, lips flanged, rhythmical sucking.  Audible Swallowing: A few with stimulation  Type of Nipple: Everted at rest and after stimulation  Comfort (Breast/Nipple): Filling, red/small blisters or bruises, mild/mod discomfort  Problem noted: Mild/Moderate discomfort Interventions (Mild/moderate discomfort): Comfort gels;Hand expression  Hold (Positioning): No assistance needed to correctly position infant at breast.  LATCH Score: 8  Lactation Tools Discussed/Used [facility]   Consult Status Consult Status: Complete    Pamelia HoitWeeks, Huel Centola D 09/28/2014, 9:42 AM

## 2014-09-29 ENCOUNTER — Encounter (HOSPITAL_COMMUNITY): Payer: Self-pay | Admitting: Obstetrics and Gynecology

## 2015-09-09 DIAGNOSIS — R5383 Other fatigue: Secondary | ICD-10-CM | POA: Diagnosis not present

## 2015-09-09 DIAGNOSIS — Z13220 Encounter for screening for lipoid disorders: Secondary | ICD-10-CM | POA: Diagnosis not present

## 2015-09-15 DIAGNOSIS — Z6829 Body mass index (BMI) 29.0-29.9, adult: Secondary | ICD-10-CM | POA: Diagnosis not present

## 2015-09-15 DIAGNOSIS — F419 Anxiety disorder, unspecified: Secondary | ICD-10-CM | POA: Diagnosis not present

## 2015-09-15 DIAGNOSIS — R5383 Other fatigue: Secondary | ICD-10-CM | POA: Diagnosis not present

## 2015-09-15 DIAGNOSIS — Z1389 Encounter for screening for other disorder: Secondary | ICD-10-CM | POA: Diagnosis not present

## 2015-09-25 DIAGNOSIS — Z6828 Body mass index (BMI) 28.0-28.9, adult: Secondary | ICD-10-CM | POA: Diagnosis not present

## 2015-09-25 DIAGNOSIS — H6692 Otitis media, unspecified, left ear: Secondary | ICD-10-CM | POA: Diagnosis not present

## 2015-10-06 DIAGNOSIS — F419 Anxiety disorder, unspecified: Secondary | ICD-10-CM | POA: Diagnosis not present

## 2015-10-06 DIAGNOSIS — Z6828 Body mass index (BMI) 28.0-28.9, adult: Secondary | ICD-10-CM | POA: Diagnosis not present

## 2015-11-04 DIAGNOSIS — F419 Anxiety disorder, unspecified: Secondary | ICD-10-CM | POA: Diagnosis not present

## 2015-11-04 DIAGNOSIS — Z6828 Body mass index (BMI) 28.0-28.9, adult: Secondary | ICD-10-CM | POA: Diagnosis not present

## 2015-11-17 DIAGNOSIS — Z6829 Body mass index (BMI) 29.0-29.9, adult: Secondary | ICD-10-CM | POA: Diagnosis not present

## 2015-11-17 DIAGNOSIS — Z01419 Encounter for gynecological examination (general) (routine) without abnormal findings: Secondary | ICD-10-CM | POA: Diagnosis not present

## 2015-12-08 DIAGNOSIS — Z713 Dietary counseling and surveillance: Secondary | ICD-10-CM | POA: Diagnosis not present

## 2015-12-08 DIAGNOSIS — F419 Anxiety disorder, unspecified: Secondary | ICD-10-CM | POA: Diagnosis not present

## 2015-12-08 DIAGNOSIS — Z6829 Body mass index (BMI) 29.0-29.9, adult: Secondary | ICD-10-CM | POA: Diagnosis not present

## 2015-12-23 DIAGNOSIS — F419 Anxiety disorder, unspecified: Secondary | ICD-10-CM | POA: Diagnosis not present

## 2015-12-23 DIAGNOSIS — Z713 Dietary counseling and surveillance: Secondary | ICD-10-CM | POA: Diagnosis not present

## 2015-12-23 DIAGNOSIS — Z6829 Body mass index (BMI) 29.0-29.9, adult: Secondary | ICD-10-CM | POA: Diagnosis not present

## 2015-12-23 DIAGNOSIS — H6593 Unspecified nonsuppurative otitis media, bilateral: Secondary | ICD-10-CM | POA: Diagnosis not present

## 2015-12-31 DIAGNOSIS — Z6828 Body mass index (BMI) 28.0-28.9, adult: Secondary | ICD-10-CM | POA: Diagnosis not present

## 2015-12-31 DIAGNOSIS — Z713 Dietary counseling and surveillance: Secondary | ICD-10-CM | POA: Diagnosis not present

## 2016-02-01 DIAGNOSIS — Z713 Dietary counseling and surveillance: Secondary | ICD-10-CM | POA: Diagnosis not present

## 2016-02-01 DIAGNOSIS — Z6828 Body mass index (BMI) 28.0-28.9, adult: Secondary | ICD-10-CM | POA: Diagnosis not present

## 2016-02-01 DIAGNOSIS — F419 Anxiety disorder, unspecified: Secondary | ICD-10-CM | POA: Diagnosis not present

## 2016-03-02 DIAGNOSIS — F419 Anxiety disorder, unspecified: Secondary | ICD-10-CM | POA: Diagnosis not present

## 2016-03-02 DIAGNOSIS — Z6828 Body mass index (BMI) 28.0-28.9, adult: Secondary | ICD-10-CM | POA: Diagnosis not present

## 2016-03-08 DIAGNOSIS — Z30432 Encounter for removal of intrauterine contraceptive device: Secondary | ICD-10-CM | POA: Diagnosis not present

## 2016-04-04 DIAGNOSIS — Z6829 Body mass index (BMI) 29.0-29.9, adult: Secondary | ICD-10-CM | POA: Diagnosis not present

## 2016-04-04 DIAGNOSIS — J0110 Acute frontal sinusitis, unspecified: Secondary | ICD-10-CM | POA: Diagnosis not present

## 2016-05-31 DIAGNOSIS — N911 Secondary amenorrhea: Secondary | ICD-10-CM | POA: Diagnosis not present

## 2016-06-07 DIAGNOSIS — Z3480 Encounter for supervision of other normal pregnancy, unspecified trimester: Secondary | ICD-10-CM | POA: Diagnosis not present

## 2016-06-07 DIAGNOSIS — Z3685 Encounter for antenatal screening for Streptococcus B: Secondary | ICD-10-CM | POA: Diagnosis not present

## 2016-06-07 DIAGNOSIS — Z23 Encounter for immunization: Secondary | ICD-10-CM | POA: Diagnosis not present

## 2016-06-07 DIAGNOSIS — Z3481 Encounter for supervision of other normal pregnancy, first trimester: Secondary | ICD-10-CM | POA: Diagnosis not present

## 2016-06-23 DIAGNOSIS — Z3480 Encounter for supervision of other normal pregnancy, unspecified trimester: Secondary | ICD-10-CM | POA: Diagnosis not present

## 2016-06-23 DIAGNOSIS — Z113 Encounter for screening for infections with a predominantly sexual mode of transmission: Secondary | ICD-10-CM | POA: Diagnosis not present

## 2016-06-23 DIAGNOSIS — Z3400 Encounter for supervision of normal first pregnancy, unspecified trimester: Secondary | ICD-10-CM | POA: Diagnosis not present

## 2016-06-23 DIAGNOSIS — Z3A12 12 weeks gestation of pregnancy: Secondary | ICD-10-CM | POA: Diagnosis not present

## 2016-07-12 DIAGNOSIS — O3680X0 Pregnancy with inconclusive fetal viability, not applicable or unspecified: Secondary | ICD-10-CM | POA: Diagnosis not present

## 2016-07-12 DIAGNOSIS — Z3A15 15 weeks gestation of pregnancy: Secondary | ICD-10-CM | POA: Diagnosis not present

## 2016-08-11 DIAGNOSIS — Z363 Encounter for antenatal screening for malformations: Secondary | ICD-10-CM | POA: Diagnosis not present

## 2016-08-11 DIAGNOSIS — Z3A19 19 weeks gestation of pregnancy: Secondary | ICD-10-CM | POA: Diagnosis not present

## 2016-10-05 DIAGNOSIS — Z3480 Encounter for supervision of other normal pregnancy, unspecified trimester: Secondary | ICD-10-CM | POA: Diagnosis not present

## 2016-10-05 DIAGNOSIS — Z23 Encounter for immunization: Secondary | ICD-10-CM | POA: Diagnosis not present

## 2016-12-06 DIAGNOSIS — Z3685 Encounter for antenatal screening for Streptococcus B: Secondary | ICD-10-CM | POA: Diagnosis not present

## 2016-12-06 DIAGNOSIS — Z3480 Encounter for supervision of other normal pregnancy, unspecified trimester: Secondary | ICD-10-CM | POA: Diagnosis not present

## 2016-12-14 DIAGNOSIS — O34211 Maternal care for low transverse scar from previous cesarean delivery: Secondary | ICD-10-CM | POA: Diagnosis not present

## 2016-12-14 DIAGNOSIS — Z3A36 36 weeks gestation of pregnancy: Secondary | ICD-10-CM | POA: Diagnosis not present

## 2016-12-20 ENCOUNTER — Encounter (HOSPITAL_COMMUNITY): Payer: Self-pay | Admitting: *Deleted

## 2016-12-21 ENCOUNTER — Telehealth (HOSPITAL_COMMUNITY): Payer: Self-pay | Admitting: *Deleted

## 2016-12-21 NOTE — Telephone Encounter (Signed)
Preadmission screen  

## 2016-12-22 ENCOUNTER — Encounter (HOSPITAL_COMMUNITY): Payer: Self-pay

## 2016-12-30 ENCOUNTER — Encounter (HOSPITAL_COMMUNITY)
Admission: RE | Admit: 2016-12-30 | Discharge: 2016-12-30 | Disposition: A | Discharge status: Home / Self Care | Payer: BLUE CROSS/BLUE SHIELD | Source: Ambulatory Visit | Attending: Obstetrics & Gynecology | Admitting: Obstetrics & Gynecology

## 2016-12-30 DIAGNOSIS — O34211 Maternal care for low transverse scar from previous cesarean delivery: Secondary | ICD-10-CM | POA: Diagnosis not present

## 2016-12-30 DIAGNOSIS — O99824 Streptococcus B carrier state complicating childbirth: Secondary | ICD-10-CM | POA: Diagnosis not present

## 2016-12-30 DIAGNOSIS — Z3A40 40 weeks gestation of pregnancy: Secondary | ICD-10-CM | POA: Diagnosis not present

## 2016-12-30 LAB — CBC
HCT: 34.2 % — ABNORMAL LOW (ref 36.0–46.0)
HEMOGLOBIN: 11.4 g/dL — AB (ref 12.0–15.0)
MCH: 28.2 pg (ref 26.0–34.0)
MCHC: 33.3 g/dL (ref 30.0–36.0)
MCV: 84.7 fL (ref 78.0–100.0)
Platelets: 286 10*3/uL (ref 150–400)
RBC: 4.04 MIL/uL (ref 3.87–5.11)
RDW: 13.7 % (ref 11.5–15.5)
WBC: 11.4 10*3/uL — AB (ref 4.0–10.5)

## 2016-12-30 LAB — TYPE AND SCREEN
ABO/RH(D): O POS
ANTIBODY SCREEN: NEGATIVE

## 2016-12-30 NOTE — Patient Instructions (Signed)
19 Cheryl FujitaLauren Collins  12/30/2016   Your procedure is scheduled on:  01/02/2017  [facility]  Pick up the phone at the desk and dial [phone].   Call this number if you have problems the morning of surgery: [phone]   Remember:   Do not eat food:After Midnight.  Do not drink clear liquids: After Midnight.  Take these medicines the morning of surgery with A SIP OF WATER: may take zantac  Do not wear jewelry, make-up or nail polish.  Do not wear lotions, powders, or perfumes. Do not wear deodorant.  Do not shave 48 hours prior to surgery.  Do not bring valuables to the hospital.  [facility] not   responsible for any belongings or valuables brought to the hospital.  Contacts, dentures or bridgework may not be worn into surgery.  Leave suitcase in the car. After surgery it may be brought to your room.  For patients admitted to the hospital, checkout time is 11:00 AM the day of              discharge.   Patients discharged the day of surgery will not be allowed to drive             home.  Name and phone number of your driver: na  Special Instructions:   N/A   Please read over the following fact sheets that you were given:   Surgical Site Infection Prevention

## 2016-12-31 LAB — RPR: RPR: NONREACTIVE

## 2017-01-01 NOTE — H&P (Signed)
Cheryl FujitaLauren Collins is a 26 y.o. female presenting for repeat cesarean section; declines TOLAC.  Antepartum course uncomplicated.  History of nephrolithiasis; none this pregnancy.  GBS positive.   OB History    Gravida Para Term Preterm AB Living   2 1 1     1    SAB TAB Ectopic Multiple Live Births         0 1     Past Medical History:  Diagnosis Date  . Hx of varicella   . Kidney stone   . Vaginal Pap smear, abnormal    Past Surgical History:  Procedure Laterality Date  . CESAREAN SECTION N/A 09/26/2014   Procedure: CESAREAN SECTION;  Surgeon: Zelphia CairoGretchen Adkins, MD;  Location: [facility];  Service: Obstetrics;  Laterality: N/A;  . COLPOSCOPY    . CYSTOSCOPY W/ RETROGRADES Right 05/28/2014   Procedure: CYSTOSCOPY WITH RETROGRADE PYELOGRAM;  Surgeon: Sebastian Acheheodore Manny, MD;  Location: [facility];  Service: Urology;  Laterality: Right;  . CYSTOSCOPY WITH STENT PLACEMENT Right 05/28/2014   Procedure: CYSTOSCOPY WITH STENT PLACEMENT;  Surgeon: Sebastian Acheheodore Manny, MD;  Location: [facility];  Service: Urology;  Laterality: Right;  . ear drum reconstruction    . PILONIDAL CYST EXCISION    . throat cystectomy    . TONSILLECTOMY    . URETEROSCOPY Right 05/28/2014   Procedure: URETEROSCOPY/STONE BASKET EXTRACTION;  Surgeon: Sebastian Acheheodore Manny, MD;  Location: [facility];  Service: Urology;  Laterality: Right;   Family History: family history includes Alcohol abuse in her paternal grandfather; Cancer in her paternal grandfather; Diabetes in her maternal aunt, maternal grandmother, and paternal grandmother. Social History:  reports that she has never smoked. She has never used smokeless tobacco. She reports that she does not drink alcohol or use drugs.     Maternal Diabetes: No Genetic Screening: Normal Maternal Ultrasounds/Referrals: Normal Fetal Ultrasounds or other Referrals:  None Maternal Substance Abuse:  No Significant Maternal Medications:  None Significant Maternal Lab Results:  Lab values include: Group B Strep  positive Other Comments:  None  ROS Maternal Medical History:  Prenatal complications: no prenatal complications Prenatal Complications - Diabetes: none.      Last menstrual period 03/31/2016, unknown if currently breastfeeding. Maternal Exam:  Abdomen: Patient reports no abdominal tenderness. Surgical scars: low transverse.   Fundal height is c/w dates.   Estimated fetal weight is 7#8.       Physical Exam  Constitutional: She is oriented to person, place, and time. She appears well-developed and well-nourished.  GI: Soft. There is no rebound and no guarding.  Neurological: She is alert and oriented to person, place, and time.  Skin: Skin is warm and dry.  Psychiatric: She has a normal mood and affect. Her behavior is normal.    Prenatal labs: ABO, Rh: --/--/O POS (08/10 1035) Antibody: NEG (08/10 1035) Rubella:   RPR: Non Reactive (08/10 1035)  HBsAg:    HIV:    GBS:     Assessment/Plan: 26yo G2P1001 at 40w for repeat C/S -Patient has been counseled re: risk of bleeding, infection, scarring and damage to surrounding structures.  All questions were answered and the patient wishes to proceed.   Cheryl Collins 01/01/2017, 6:54 PM

## 2017-01-01 NOTE — Anesthesia Preprocedure Evaluation (Addendum)
Anesthesia Evaluation  Patient identified by MRN, date of birth, ID band Patient awake    Reviewed: Allergy & Precautions, NPO status , Patient's Chart, lab work & pertinent test results  Airway Mallampati: I  TM Distance: >3 FB Neck ROM: Full    Dental  (+) Teeth Intact, Dental Advisory Given   Pulmonary neg pulmonary ROS,    breath sounds clear to auscultation       Cardiovascular negative cardio ROS   Rhythm:Regular Rate:Normal     Neuro/Psych negative neurological ROS  negative psych ROS   GI/Hepatic negative GI ROS, Neg liver ROS,   Endo/Other  negative endocrine ROS  Renal/GU      Musculoskeletal negative musculoskeletal ROS (+)   Abdominal   Peds  Hematology negative hematology ROS (+)   Anesthesia Other Findings Day of surgery medications reviewed with the patient.  Reproductive/Obstetrics (+) Pregnancy                            Lab Results  Component Value Date   WBC 11.4 (H) 12/30/2016   HGB 11.4 (L) 12/30/2016   HCT 34.2 (L) 12/30/2016   MCV 84.7 12/30/2016   PLT 286 12/30/2016     Anesthesia Physical Anesthesia Plan  ASA: II  Anesthesia Plan: Spinal   Post-op Pain Management:    Induction:   PONV Risk Score and Plan:   Airway Management Planned: Natural Airway  Additional Equipment:   Intra-op Plan:   Post-operative Plan:   Informed Consent:   Plan Discussed with:   Anesthesia Plan Comments:         Anesthesia Quick Evaluation

## 2017-01-02 ENCOUNTER — Encounter (HOSPITAL_COMMUNITY): Payer: Self-pay

## 2017-01-02 ENCOUNTER — Inpatient Hospital Stay (HOSPITAL_COMMUNITY)
Admission: RE | Admit: 2017-01-02 | Discharge: 2017-01-04 | DRG: 766 | Disposition: A | Discharge status: Home / Self Care | Payer: BLUE CROSS/BLUE SHIELD | Source: Ambulatory Visit | Attending: Obstetrics & Gynecology | Admitting: Obstetrics & Gynecology

## 2017-01-02 ENCOUNTER — Inpatient Hospital Stay (HOSPITAL_COMMUNITY): Payer: BLUE CROSS/BLUE SHIELD | Admitting: Anesthesiology

## 2017-01-02 ENCOUNTER — Encounter (HOSPITAL_COMMUNITY)
Admission: RE | Disposition: A | Discharge status: Home / Self Care | Payer: Self-pay | Source: Ambulatory Visit | Attending: Obstetrics & Gynecology

## 2017-01-02 DIAGNOSIS — O99824 Streptococcus B carrier state complicating childbirth: Secondary | ICD-10-CM | POA: Diagnosis present

## 2017-01-02 DIAGNOSIS — Z98891 History of uterine scar from previous surgery: Secondary | ICD-10-CM

## 2017-01-02 DIAGNOSIS — Z3A40 40 weeks gestation of pregnancy: Secondary | ICD-10-CM | POA: Diagnosis not present

## 2017-01-02 DIAGNOSIS — O34211 Maternal care for low transverse scar from previous cesarean delivery: Secondary | ICD-10-CM | POA: Diagnosis not present

## 2017-01-02 SURGERY — Surgical Case
Anesthesia: Spinal

## 2017-01-02 MED ORDER — WITCH HAZEL-GLYCERIN EX PADS: 1.0000 "application " | MEDICATED_PAD | CUTANEOUS | Status: DC | PRN

## 2017-01-02 MED ORDER — ZOLPIDEM TARTRATE 5 MG PO TABS: 5.0000 mg | ORAL_TABLET | Freq: Every evening | ORAL | Status: DC | PRN

## 2017-01-02 MED ORDER — TETANUS-DIPHTH-ACELL PERTUSSIS 5-2.5-18.5 LF-MCG/0.5 IM SUSP: 0.5000 mL | Freq: Once | INTRAMUSCULAR | Status: DC

## 2017-01-02 MED ORDER — MENTHOL 3 MG MT LOZG: 1.0000 | LOZENGE | OROMUCOSAL | Status: DC | PRN

## 2017-01-02 MED ORDER — SIMETHICONE 80 MG PO CHEW: 80.0000 mg | CHEWABLE_TABLET | ORAL | Status: DC | PRN

## 2017-01-02 MED ORDER — MORPHINE SULFATE (PF) 0.5 MG/ML IJ SOLN
INTRAMUSCULAR | Status: DC | PRN
  Administered 2017-01-02: .2 mg via INTRATHECAL

## 2017-01-02 MED ORDER — PROMETHAZINE HCL 25 MG/ML IJ SOLN: 6.2500 mg | INTRAMUSCULAR | Status: DC | PRN

## 2017-01-02 MED ORDER — LACTATED RINGERS IV SOLN
INTRAVENOUS | Status: DC
  Administered 2017-01-02: 08:00:00 via INTRAVENOUS

## 2017-01-02 MED ORDER — NALBUPHINE HCL 10 MG/ML IJ SOLN: 5.0000 mg | INTRAMUSCULAR | Status: DC | PRN

## 2017-01-02 MED ORDER — MORPHINE SULFATE (PF) 0.5 MG/ML IJ SOLN
INTRAMUSCULAR | Status: AC
  Filled 2017-01-02: qty 10

## 2017-01-02 MED ORDER — MEPERIDINE HCL 25 MG/ML IJ SOLN: 6.2500 mg | INTRAMUSCULAR | Status: DC | PRN

## 2017-01-02 MED ORDER — PHENYLEPHRINE 8 MG IN D5W 100 ML (0.08MG/ML) PREMIX OPTIME
INJECTION | INTRAVENOUS | Status: AC
  Filled 2017-01-02: qty 100

## 2017-01-02 MED ORDER — KETOROLAC TROMETHAMINE 30 MG/ML IJ SOLN: 30.0000 mg | Freq: Four times a day (QID) | INTRAMUSCULAR | Status: DC | PRN

## 2017-01-02 MED ORDER — SENNOSIDES-DOCUSATE SODIUM 8.6-50 MG PO TABS
2.0000 | ORAL_TABLET | ORAL | Status: DC
  Administered 2017-01-03 – 2017-01-04 (×2): 2 via ORAL
  Filled 2017-01-02 (×2): qty 2

## 2017-01-02 MED ORDER — SODIUM CHLORIDE 0.9% FLUSH: 3.0000 mL | INTRAVENOUS | Status: DC | PRN

## 2017-01-02 MED ORDER — SIMETHICONE 80 MG PO CHEW
80.0000 mg | CHEWABLE_TABLET | Freq: Three times a day (TID) | ORAL | Status: DC
  Administered 2017-01-02 – 2017-01-04 (×4): 80 mg via ORAL
  Filled 2017-01-02 (×4): qty 1

## 2017-01-02 MED ORDER — NALBUPHINE HCL 10 MG/ML IJ SOLN: 5.0000 mg | Freq: Once | INTRAMUSCULAR | Status: DC | PRN

## 2017-01-02 MED ORDER — PHENYLEPHRINE 8 MG IN D5W 100 ML (0.08MG/ML) PREMIX OPTIME
INJECTION | INTRAVENOUS | Status: DC | PRN
  Administered 2017-01-02: 60 ug/min via INTRAVENOUS

## 2017-01-02 MED ORDER — DIBUCAINE 1 % RE OINT: 1.0000 "application " | TOPICAL_OINTMENT | RECTAL | Status: DC | PRN

## 2017-01-02 MED ORDER — BUPIVACAINE IN DEXTROSE 0.75-8.25 % IT SOLN
INTRATHECAL | Status: AC
  Filled 2017-01-02: qty 2

## 2017-01-02 MED ORDER — OXYTOCIN 10 UNIT/ML IJ SOLN
INTRAMUSCULAR | Status: AC
  Filled 2017-01-02: qty 4

## 2017-01-02 MED ORDER — DEXTROSE 5 % IV SOLN
1.0000 ug/kg/h | INTRAVENOUS | Status: DC | PRN
  Filled 2017-01-02: qty 2

## 2017-01-02 MED ORDER — DIPHENHYDRAMINE HCL 25 MG PO CAPS
25.0000 mg | ORAL_CAPSULE | Freq: Four times a day (QID) | ORAL | Status: DC | PRN
  Administered 2017-01-02 – 2017-01-03 (×2): 25 mg via ORAL
  Filled 2017-01-02 (×2): qty 1

## 2017-01-02 MED ORDER — OXYCODONE-ACETAMINOPHEN 5-325 MG PO TABS
1.0000 | ORAL_TABLET | ORAL | Status: DC | PRN
  Administered 2017-01-03 (×2): 1 via ORAL
  Filled 2017-01-02: qty 1

## 2017-01-02 MED ORDER — ONDANSETRON HCL 4 MG/2ML IJ SOLN
4.0000 mg | Freq: Three times a day (TID) | INTRAMUSCULAR | Status: DC | PRN
  Administered 2017-01-02: 4 mg via INTRAVENOUS

## 2017-01-02 MED ORDER — LACTATED RINGERS IV SOLN: INTRAVENOUS | Status: DC

## 2017-01-02 MED ORDER — DIPHENHYDRAMINE HCL 25 MG PO CAPS: 25.0000 mg | ORAL_CAPSULE | ORAL | Status: DC | PRN

## 2017-01-02 MED ORDER — FENTANYL CITRATE (PF) 100 MCG/2ML IJ SOLN: 25.0000 ug | INTRAMUSCULAR | Status: DC | PRN

## 2017-01-02 MED ORDER — DEXAMETHASONE SODIUM PHOSPHATE 4 MG/ML IJ SOLN
INTRAMUSCULAR | Status: DC | PRN
  Administered 2017-01-02: 4 mg via INTRAVENOUS

## 2017-01-02 MED ORDER — OXYCODONE-ACETAMINOPHEN 5-325 MG PO TABS
2.0000 | ORAL_TABLET | ORAL | Status: DC | PRN
  Filled 2017-01-02: qty 2

## 2017-01-02 MED ORDER — FENTANYL CITRATE (PF) 100 MCG/2ML IJ SOLN
INTRAMUSCULAR | Status: AC
  Filled 2017-01-02: qty 2

## 2017-01-02 MED ORDER — SCOPOLAMINE 1 MG/3DAYS TD PT72
1.0000 | MEDICATED_PATCH | Freq: Once | TRANSDERMAL | Status: DC
  Administered 2017-01-02: 1.5 mg via TRANSDERMAL
  Filled 2017-01-02: qty 1

## 2017-01-02 MED ORDER — ACETAMINOPHEN 325 MG PO TABS: 650.0000 mg | ORAL_TABLET | ORAL | Status: DC | PRN

## 2017-01-02 MED ORDER — IBUPROFEN 600 MG PO TABS
600.0000 mg | ORAL_TABLET | Freq: Four times a day (QID) | ORAL | Status: DC
  Administered 2017-01-02 – 2017-01-04 (×9): 600 mg via ORAL
  Filled 2017-01-02 (×10): qty 1

## 2017-01-02 MED ORDER — COCONUT OIL OIL: 1.0000 "application " | TOPICAL_OIL | Status: DC | PRN

## 2017-01-02 MED ORDER — LACTATED RINGERS IV SOLN
INTRAVENOUS | Status: DC
  Administered 2017-01-02: 20:00:00 via INTRAVENOUS

## 2017-01-02 MED ORDER — PRENATAL MULTIVITAMIN CH
1.0000 | ORAL_TABLET | Freq: Every day | ORAL | Status: DC
  Administered 2017-01-02: 1 via ORAL
  Filled 2017-01-02 (×3): qty 1

## 2017-01-02 MED ORDER — FENTANYL CITRATE (PF) 100 MCG/2ML IJ SOLN
25.0000 ug | INTRAMUSCULAR | Status: DC | PRN
  Administered 2017-01-02: 10 ug via INTRAVENOUS

## 2017-01-02 MED ORDER — NALOXONE HCL 0.4 MG/ML IJ SOLN: 0.4000 mg | INTRAMUSCULAR | Status: DC | PRN

## 2017-01-02 MED ORDER — OXYTOCIN 40 UNITS IN LACTATED RINGERS INFUSION - SIMPLE MED: 2.5000 [IU]/h | INTRAVENOUS | Status: AC

## 2017-01-02 MED ORDER — SODIUM CHLORIDE 0.9 % IR SOLN
Status: DC | PRN
  Administered 2017-01-02: 1

## 2017-01-02 MED ORDER — DIPHENHYDRAMINE HCL 50 MG/ML IJ SOLN: 12.5000 mg | INTRAMUSCULAR | Status: DC | PRN

## 2017-01-02 MED ORDER — BUPIVACAINE IN DEXTROSE 0.75-8.25 % IT SOLN
INTRATHECAL | Status: DC | PRN
  Administered 2017-01-02: 1.6 mL via INTRATHECAL

## 2017-01-02 MED ORDER — OXYTOCIN 10 UNIT/ML IJ SOLN
INTRAMUSCULAR | Status: DC | PRN
  Administered 2017-01-02: 40 [IU] via INTRAVENOUS

## 2017-01-02 MED ORDER — DEXTROSE 5 % IV SOLN
INTRAVENOUS | Status: AC
  Administered 2017-01-02: 100 mL via INTRAVENOUS
  Filled 2017-01-02: qty 9

## 2017-01-02 MED ORDER — SIMETHICONE 80 MG PO CHEW
80.0000 mg | CHEWABLE_TABLET | ORAL | Status: DC
  Administered 2017-01-03 – 2017-01-04 (×2): 80 mg via ORAL
  Filled 2017-01-02 (×2): qty 1

## 2017-01-02 MED ORDER — ONDANSETRON HCL 4 MG/2ML IJ SOLN
INTRAMUSCULAR | Status: AC
  Filled 2017-01-02: qty 2

## 2017-01-02 MED ORDER — LACTATED RINGERS IV SOLN
INTRAVENOUS | Status: DC
  Administered 2017-01-02 (×2): via INTRAVENOUS

## 2017-01-02 SURGICAL SUPPLY — 31 items
BENZOIN TINCTURE PRP APPL 2/3 (GAUZE/BANDAGES/DRESSINGS) ×2 IMPLANT
CHLORAPREP W/TINT 26ML (MISCELLANEOUS) ×2 IMPLANT
CLAMP CORD UMBIL (MISCELLANEOUS) IMPLANT
CLOTH BEACON ORANGE TIMEOUT ST (SAFETY) ×2 IMPLANT
DERMABOND ADVANCED (GAUZE/BANDAGES/DRESSINGS)
DERMABOND ADVANCED .7 DNX12 (GAUZE/BANDAGES/DRESSINGS) IMPLANT
DRSG OPSITE POSTOP 4X10 (GAUZE/BANDAGES/DRESSINGS) ×2 IMPLANT
ELECT REM PT RETURN 9FT ADLT (ELECTROSURGICAL) ×2
ELECTRODE REM PT RTRN 9FT ADLT (ELECTROSURGICAL) ×1 IMPLANT
EXTRACTOR VACUUM KIWI (MISCELLANEOUS) IMPLANT
GLOVE BIO SURGEON STRL SZ 6 (GLOVE) ×2 IMPLANT
GLOVE BIOGEL PI IND STRL 6 (GLOVE) ×2 IMPLANT
GLOVE BIOGEL PI IND STRL 7.0 (GLOVE) ×1 IMPLANT
GLOVE BIOGEL PI INDICATOR 6 (GLOVE) ×2
GLOVE BIOGEL PI INDICATOR 7.0 (GLOVE) ×1
GOWN STRL REUS W/TWL LRG LVL3 (GOWN DISPOSABLE) ×4 IMPLANT
KIT ABG SYR 3ML LUER SLIP (SYRINGE) ×2 IMPLANT
NEEDLE HYPO 25X5/8 SAFETYGLIDE (NEEDLE) ×2 IMPLANT
NS IRRIG 1000ML POUR BTL (IV SOLUTION) ×2 IMPLANT
PACK C SECTION WH (CUSTOM PROCEDURE TRAY) ×2 IMPLANT
PAD OB MATERNITY 4.3X12.25 (PERSONAL CARE ITEMS) ×2 IMPLANT
PENCIL SMOKE EVAC W/HOLSTER (ELECTROSURGICAL) ×2 IMPLANT
STRIP CLOSURE SKIN 1/2X4 (GAUZE/BANDAGES/DRESSINGS) ×2 IMPLANT
SUT CHROMIC 0 CTX 36 (SUTURE) ×6 IMPLANT
SUT MON AB 2-0 CT1 27 (SUTURE) ×2 IMPLANT
SUT PDS AB 0 CT1 27 (SUTURE) IMPLANT
SUT PLAIN 0 NONE (SUTURE) IMPLANT
SUT VIC AB 0 CT1 36 (SUTURE) IMPLANT
SUT VIC AB 4-0 KS 27 (SUTURE) IMPLANT
TOWEL OR 17X24 6PK STRL BLUE (TOWEL DISPOSABLE) ×2 IMPLANT
TRAY FOLEY BAG SILVER LF 14FR (SET/KITS/TRAYS/PACK) IMPLANT

## 2017-01-02 NOTE — Progress Notes (Signed)
No change to H&P.  Zayvian Mcmurtry, DO 

## 2017-01-02 NOTE — Anesthesia Postprocedure Evaluation (Signed)
Anesthesia Post Note  Patient: Cheryl Collins  Procedure(s) Performed: Procedure(s) (LRB): CESAREAN SECTION (N/A)     Patient location during evaluation: PACU Anesthesia Type: Spinal Level of consciousness: oriented and awake and alert Pain management: pain level controlled Vital Signs Assessment: post-procedure vital signs reviewed and stable Respiratory status: spontaneous breathing, respiratory function stable and patient connected to nasal cannula oxygen Cardiovascular status: blood pressure returned to baseline and stable Postop Assessment: no headache, no backache, spinal receding and patient able to bend at knees Anesthetic complications: no    Last Vitals:  Vitals:   01/02/17 0915 01/02/17 0930  BP:    Pulse:    Resp:    Temp: 36.6 C (!) 36.4 C  SpO2:      Last Pain:  Vitals:   01/02/17 0930  TempSrc: Oral  PainSc: 0-No pain   Pain Goal: Patients Stated Pain Goal: 4 (01/02/17 0649)               Shelton SilvasKevin D Hollis

## 2017-01-02 NOTE — Anesthesia Procedure Notes (Signed)
Spinal  Patient location during procedure: OR Start time: 01/02/2017 7:40 AM End time: 01/02/2017 7:43 AM Staffing Anesthesiologist: Suella Broad D Performed: anesthesiologist  Preanesthetic Checklist Completed: patient identified, site marked, surgical consent, pre-op evaluation, timeout performed, IV checked, risks and benefits discussed and monitors and equipment checked Spinal Block Patient position: sitting Prep: Betadine Patient monitoring: heart rate, continuous pulse ox, blood pressure and cardiac monitor Approach: midline Location: L4-5 Injection technique: single-shot Needle Needle type: Introducer and Pencan  Needle gauge: 24 G Needle length: 9 cm Additional Notes Negative paresthesia. Negative blood return. Positive free-flowing CSF. Expiration date of kit checked and confirmed. Patient tolerated procedure well, without complications.

## 2017-01-02 NOTE — Lactation Note (Signed)
This note was copied from a baby's chart. Lactation Consultation Note  P2 mom BF exp. With first child: 11 wks.  Some nipple soreness/milk "dried up" at 11 wks Mom holding infant STS.  Hand exp. Taught.  Several drops of colostrum seen immediately when starting hand expression.  Illustrations were shown in book of how to hand express.  Mom declined to demonstrate due to visitors.  Mom attempted to feed in laid back cradle hold but infant could not latch deeply.   LC assisted mom in positioning infant in cross cradle.  LC encouraged mom to latch in cross cradle then to switch to cradle if she desired.  Infant latched easily with wide gape and flanged lips.  [facility] taught [facility] how to use breast compression and massage in order to stimulate infant suck and swallow.  Swallows heard with compression.  Mom felt uterine contractions with bf.  Deep, rhythmic jaw movements noted during feed.  Mom was taught importance of good head and neck control when latching and throughout feed.  Mom used pillows and boppy to support arms during feed.  LC reviewed BF basics; Mom encouraged to feed on demand at least 8-12 times in a 24 hour period.  Mom encouraged to try to latch in football hold or cross cradle in able to ensure a deeper latch.  Mom expressed concerns about milk coming in; she had to use DEBP at 5 days.  LC provided mom with hand pump and encouraged mom if she was still feeling full when milk supply was getting regulated by infant, to hand express to relieve pressure or use a hand pump to pump just until more comfortable in order to not stimulate breast more and get engorged.  Mom is aware of OP services available and resource sheet given.  Patient Name: Cheryl Collins Today's Date: 01/02/2017 Reason for consult: Initial assessment   Maternal Data Formula Feeding for Exclusion: No Has patient been taught Hand Expression?: Yes Does the patient have breastfeeding experience prior to this delivery?: Yes (11 wks  bf/then over night milk "dried up"  difficult transitioning to bottle)  Feeding Feeding Type: Breast Fed Length of feed: 12 min  LATCH Score Latch: Grasps breast easily, tongue down, lips flanged, rhythmical sucking.  Audible Swallowing: A few with stimulation  Type of Nipple: Everted at rest and after stimulation  Comfort (Breast/Nipple): Soft / non-tender  Hold (Positioning): Assistance needed to correctly position infant at breast and maintain latch.  LATCH Score: 8  Interventions Interventions: Breast feeding basics reviewed;Assisted with latch;Skin to skin;Breast massage;Hand express;Adjust position;Expressed milk;Support pillows;Position options  Lactation Tools Discussed/Used     Consult Status Consult Status: Follow-up Date: 01/03/17 Follow-up type: In-patient    Maryruth HancockKelly Suzanne [facility] 01/02/2017, 3:14 PM

## 2017-01-02 NOTE — Op Note (Signed)
Cheryl Collins PROCEDURE DATE: 01/02/2017  PREOPERATIVE DIAGNOSIS: Intrauterine pregnancy at  7936w0d weeks gestation, desire for repeat  POSTOPERATIVE DIAGNOSIS: The same  PROCEDURE:  Repeat Low Transverse Cesarean Section  SURGEON:  Dr. Mitchel HonourMegan Alfrieda Tarry  INDICATIONS: Cheryl FujitaLauren Tubbs is a 26 y.o. G2P1001 at 4536w0d scheduled for cesarean section secondary to desire for repeat.  The risks of cesarean section discussed with the patient included but were not limited to: bleeding which may require transfusion or reoperation; infection which may require antibiotics; injury to bowel, bladder, ureters or other surrounding organs; injury to the fetus; need for additional procedures including hysterectomy in the event of a life-threatening hemorrhage; placental abnormalities wth subsequent pregnancies, incisional problems, thromboembolic phenomenon and other postoperative/anesthesia complications. The patient concurred with the proposed plan, giving informed written consent for the procedure.    FINDINGS:  Viable female infant in cephalic presentation, APGARs 8,9: weight pending Clear amniotic fluid.  Intact placenta, three vessel cord.  Grossly normal uterus, ovaries and fallopian tubes.  Adhesions of the anterior abdominal wall to the uterine fundus and bladder to the mid uterine corpus.   .   ANESTHESIA:  Spinal ESTIMATED BLOOD LOSS: 725 ml SPECIMENS: Placenta sent to L&D COMPLICATIONS: None immediate  PROCEDURE IN DETAIL:  The patient received intravenous antibiotics and had sequential compression devices applied to her lower extremities while in the preoperative area.  She was then taken to the operating room where spinal anesthesia was administered and was found to be adequate. She was then placed in a dorsal supine position with a leftward tilt, and prepped and draped in a sterile manner.  A foley catheter was placed into her bladder and attached to constant gravity.  After an adequate timeout was performed, a  Pfannenstiel skin incision was made with scalpel and carried through to the underlying layer of fascia. The fascia was incised in the midline and this incision was extended bilaterally using the Mayo scissors. Kocher clamps were applied to the superior aspect of the fascial incision and the underlying rectus muscles were dissected off bluntly. A similar process was carried out on the inferior aspect of the facial incision. The rectus muscles were separated in the midline bluntly and the peritoneum was entered bluntly.  The adhesions involving the bladder were taken down sharply and developed bluntly.  The bladder blade was placed.  A transverse hysterotomy was made with a scalpel and extended bilaterally bluntly. The bladder blade was then removed. The infant was successfully delivered, and cord was clamped and cut and infant was handed over to awaiting neonatology team. Uterine massage was then administered and the placenta delivered intact with three-vessel cord. The uterus was cleared of clot and debris.  The hysterotomy was closed with 0 chrmic.  A second imbricating suture of 0-chromic was used to reinforce the incision and aid in hemostasis.  The peritoneum and rectus muscles were noted to be hemostatic and were reapproximated using 3-0 monocryl in a running fashion.  The fascia was closed with 0-PDS in a running fashion with good restoration of anatomy.  The subcutaneus tissue was copiously irrigated.  The skin was closed with 4-0 Vicryl in a subcuticular fashion.  Pt tolerated the procedure will.  All counts were correct x2.  Pt went to the recovery room in stable condition.

## 2017-01-02 NOTE — Transfer of Care (Signed)
Immediate Anesthesia Transfer of Care Note  Patient: Cheryl Collins  Procedure(s) Performed: Procedure(s) with comments: CESAREAN SECTION (N/A) - REPEAT EDC 01/02/17 ALLERGY TO AMOXICILLIN, CECLOR NEED RNFA  Patient Location: PACU  Anesthesia Type:Spinal  Level of Consciousness: awake, alert  and oriented  Airway & Oxygen Therapy: Patient Spontanous Breathing  Post-op Assessment: Report given to RN and Post -op Vital signs reviewed and stable  Post vital signs: Reviewed and stable  Last Vitals:  Vitals:   01/02/17 0540 01/02/17 0846  BP: 131/69   Pulse: 100 82  Resp: 18   Temp: 37.1 C 36.6 C  SpO2: 100% 98%    Last Pain:  Vitals:   01/02/17 0846  TempSrc: Oral  PainSc:       Patients Stated Pain Goal: 4 (01/02/17 0649)  Complications: No apparent anesthesia complications

## 2017-01-02 NOTE — Progress Notes (Signed)
G2P2 at 40.0 weeks, s/p elective RCS at 0802. pt has h/o kidney stones and PCS with no other significant history.  Pt is comfortable and has no needs at this time.

## 2017-01-03 LAB — CBC
HCT: 29.8 % — ABNORMAL LOW (ref 36.0–46.0)
Hemoglobin: 10.3 g/dL — ABNORMAL LOW (ref 12.0–15.0)
MCH: 29.1 pg (ref 26.0–34.0)
MCHC: 34.6 g/dL (ref 30.0–36.0)
MCV: 84.2 fL (ref 78.0–100.0)
PLATELETS: 268 10*3/uL (ref 150–400)
RBC: 3.54 MIL/uL — ABNORMAL LOW (ref 3.87–5.11)
RDW: 13.6 % (ref 11.5–15.5)
WBC: 14.3 10*3/uL — AB (ref 4.0–10.5)

## 2017-01-03 LAB — BIRTH TISSUE RECOVERY COLLECTION (PLACENTA DONATION)

## 2017-01-03 NOTE — Progress Notes (Signed)
Subjective: Postpartum Day 1: Cesarean Delivery Patient reports tolerating PO, + flatus and no problems voiding.    Objective: Vital signs in last 24 hours: Temp:  [97.4 F (36.3 C)-98.2 F (36.8 C)] 98.2 F (36.8 C) (08/14 0400) Pulse Rate:  [60-83] 71 (08/14 0400) Resp:  [12-22] 18 (08/14 0400) BP: (94-122)/(46-76) 122/56 (08/14 0400) SpO2:  [96 %-100 %] 99 % (08/14 0000)  Physical Exam:  General: alert, cooperative, appears stated age and no distress Lochia: appropriate Uterine Fundus: firm Incision: healing well DVT Evaluation: No evidence of DVT seen on physical exam.   Recent Labs  01/03/17 0519  HGB 10.3*  HCT 29.8*    Assessment/Plan: Status post Cesarean section. Doing well postoperatively.  Continue current care.  Hermon Zea C 01/03/2017, 8:46 AM

## 2017-01-03 NOTE — Anesthesia Postprocedure Evaluation (Signed)
Anesthesia Post Note  Patient: Cheryl FujitaLauren Collins  Procedure(s) Performed: Procedure(s) (LRB): CESAREAN SECTION (N/A)     Patient location during evaluation: Mother Baby Anesthesia Type: Spinal Level of consciousness: awake and alert Pain management: pain level controlled Vital Signs Assessment: post-procedure vital signs reviewed and stable Respiratory status: spontaneous breathing, nonlabored ventilation and respiratory function stable Cardiovascular status: blood pressure returned to baseline and stable Postop Assessment: no signs of nausea or vomiting Anesthetic complications: no    Last Vitals:  Vitals:   01/03/17 0000 01/03/17 0400  BP: (!) 122/46 (!) 122/56  Pulse: 64 71  Resp: 18 18  Temp: 36.5 C 36.8 C  SpO2: 99%     Last Pain:  Vitals:   01/03/17 0636  TempSrc:   PainSc: 0-No pain   Pain Goal: Patients Stated Pain Goal: 4 (01/02/17 [id number])               Junious SilkGILBERT,Taelyr Jantz

## 2017-01-03 NOTE — Lactation Note (Signed)
This note was copied from a baby's chart. Lactation Consultation Note: I arrived in room and mother was breastfeeding infant on the Rt breast. Observed mother sitting up in chair with infant in cradle hold. Mother reports that infant began cluster feeding at 3am. Informed Assist mother with tugging on infants lower jaw for wider gape. Observed suckling and a few swallows. Mother reports that her nipples are slightly sore. No observed redness or trama. Advised mother to rotate positions . Encouraged mother to roll infant facing her breast tummy to tummy for better latch. Mother receptive to all teaching.   Patient Name: Cheryl Collins EAVWU'JToday's Date: 01/03/2017 Reason for consult: Follow-up assessment   Maternal Data    Feeding Feeding Type: Breast Fed  LATCH Score Latch: Grasps breast easily, tongue down, lips flanged, rhythmical sucking.  Audible Swallowing: A few with stimulation  Type of Nipple: Everted at rest and after stimulation (c/o slight nipple tenderness, no redness noted)  Comfort (Breast/Nipple): Soft / non-tender  Hold (Positioning): No assistance needed to correctly position infant at breast.  LATCH Score: 9  Interventions    Lactation Tools Discussed/Used     Consult Status      Michel BickersKendrick, Evalynn Hankins McCoy 01/03/2017, 3:20 PM

## 2017-01-03 NOTE — Addendum Note (Signed)
Addendum  created 01/03/17 0749 by Junious SilkGilbert, Joceline Hinchcliff, CRNA   Sign clinical note

## 2017-01-04 MED ORDER — OXYCODONE-ACETAMINOPHEN 5-325 MG PO TABS: 2.0000 | ORAL_TABLET | ORAL | 0 refills | Status: DC | PRN

## 2017-01-04 NOTE — Discharge Summary (Signed)
Obstetric Discharge Summary Reason for Admission: cesarean section Prenatal Procedures: none Intrapartum Procedures: cesarean: low cervical, transverse Postpartum Procedures: none Complications-Operative and Postpartum: none Hemoglobin  Date Value Ref Range Status  01/03/2017 10.3 (L) 12.0 - 15.0 g/dL Final   HCT  Date Value Ref Range Status  01/03/2017 29.8 (L) 36.0 - 46.0 % Final    Physical Exam:  General: alert, cooperative and appears stated age 71Lochia: appropriate Uterine Fundus: firm Incision: healing well, no significant drainage, no dehiscence DVT Evaluation: No evidence of DVT seen on physical exam.  Discharge Diagnoses: Term Pregnancy-delivered  Discharge Information: Date: 01/04/2017 Activity: pelvic rest Diet: routine Medications: Ibuprofen and Percocet Condition: improved Instructions: refer to practice specific booklet Discharge to: home   Newborn Data: Live born female  Birth Weight: 7 lb 7.8 oz (3395 g) APGAR: 8, 9  Home with mother.  Shelsea Hangartner L 01/04/2017, 7:55 AM

## 2017-01-04 NOTE — Plan of Care (Signed)
Problem: Education: Goal: Knowledge of condition will improve Discharge education, incision care, safety and follow up reviewed with patient and significant other. Patient verbalizes understanding of information.

## 2017-01-04 NOTE — Lactation Note (Signed)
This note was copied from a baby's chart. Lactation Consultation Note  Patient Name: Cheryl Collins ZOXWR'UToday's Date: 01/04/2017   Baby 49 hours old. Experienced breastfeeding mom reports that this baby is nursing well. Discussed ways of occupying 26-year-old while BF baby. Mom aware of OP/BFSG and LC phone line assistance after D/C.   Maternal Data    Feeding Feeding Type: Breast Fed Length of feed: 15 min  LATCH Score                   Interventions    Lactation Tools Discussed/Used     Consult Status      Sherlyn HayJennifer D Macalister Arnaud 01/04/2017, 9:26 AM

## 2017-01-26 DIAGNOSIS — H7291 Unspecified perforation of tympanic membrane, right ear: Secondary | ICD-10-CM | POA: Diagnosis not present

## 2017-01-26 DIAGNOSIS — F419 Anxiety disorder, unspecified: Secondary | ICD-10-CM | POA: Diagnosis not present

## 2017-01-26 DIAGNOSIS — Z6831 Body mass index (BMI) 31.0-31.9, adult: Secondary | ICD-10-CM | POA: Diagnosis not present

## 2017-01-26 DIAGNOSIS — H6592 Unspecified nonsuppurative otitis media, left ear: Secondary | ICD-10-CM | POA: Diagnosis not present

## 2017-02-06 DIAGNOSIS — H9011 Conductive hearing loss, unilateral, right ear, with unrestricted hearing on the contralateral side: Secondary | ICD-10-CM | POA: Diagnosis not present

## 2017-02-15 DIAGNOSIS — Z1389 Encounter for screening for other disorder: Secondary | ICD-10-CM | POA: Diagnosis not present

## 2017-02-16 DIAGNOSIS — F419 Anxiety disorder, unspecified: Secondary | ICD-10-CM | POA: Diagnosis not present

## 2017-02-16 DIAGNOSIS — Z7189 Other specified counseling: Secondary | ICD-10-CM | POA: Diagnosis not present

## 2017-02-16 DIAGNOSIS — Z6830 Body mass index (BMI) 30.0-30.9, adult: Secondary | ICD-10-CM | POA: Diagnosis not present

## 2017-02-23 DIAGNOSIS — Z713 Dietary counseling and surveillance: Secondary | ICD-10-CM | POA: Diagnosis not present

## 2017-02-23 DIAGNOSIS — Z6831 Body mass index (BMI) 31.0-31.9, adult: Secondary | ICD-10-CM | POA: Diagnosis not present

## 2017-03-07 DIAGNOSIS — Z30430 Encounter for insertion of intrauterine contraceptive device: Secondary | ICD-10-CM | POA: Diagnosis not present

## 2017-03-28 DIAGNOSIS — Z6831 Body mass index (BMI) 31.0-31.9, adult: Secondary | ICD-10-CM | POA: Diagnosis not present

## 2017-03-28 DIAGNOSIS — Z713 Dietary counseling and surveillance: Secondary | ICD-10-CM | POA: Diagnosis not present

## 2017-04-19 DIAGNOSIS — Z30431 Encounter for routine checking of intrauterine contraceptive device: Secondary | ICD-10-CM | POA: Diagnosis not present

## 2017-04-20 DIAGNOSIS — N200 Calculus of kidney: Secondary | ICD-10-CM | POA: Diagnosis not present

## 2017-04-20 DIAGNOSIS — R809 Proteinuria, unspecified: Secondary | ICD-10-CM | POA: Diagnosis not present

## 2017-04-20 DIAGNOSIS — N202 Calculus of kidney with calculus of ureter: Secondary | ICD-10-CM | POA: Diagnosis not present

## 2017-04-20 DIAGNOSIS — Z6830 Body mass index (BMI) 30.0-30.9, adult: Secondary | ICD-10-CM | POA: Diagnosis not present

## 2017-04-20 DIAGNOSIS — R3129 Other microscopic hematuria: Secondary | ICD-10-CM | POA: Diagnosis not present

## 2017-04-23 ENCOUNTER — Other Ambulatory Visit: Payer: Self-pay

## 2017-04-23 ENCOUNTER — Emergency Department (HOSPITAL_COMMUNITY): Payer: BLUE CROSS/BLUE SHIELD

## 2017-04-23 ENCOUNTER — Encounter (HOSPITAL_COMMUNITY): Payer: Self-pay | Admitting: Emergency Medicine

## 2017-04-23 ENCOUNTER — Emergency Department (HOSPITAL_COMMUNITY)
Admission: EM | Admit: 2017-04-23 | Discharge: 2017-04-23 | Disposition: A | Discharge status: Home / Self Care | Payer: BLUE CROSS/BLUE SHIELD | Attending: Emergency Medicine | Admitting: Emergency Medicine

## 2017-04-23 DIAGNOSIS — Z79899 Other long term (current) drug therapy: Secondary | ICD-10-CM | POA: Diagnosis not present

## 2017-04-23 DIAGNOSIS — N200 Calculus of kidney: Secondary | ICD-10-CM

## 2017-04-23 LAB — BASIC METABOLIC PANEL
ANION GAP: 6 (ref 5–15)
BUN: 9 mg/dL (ref 6–20)
CHLORIDE: 104 mmol/L (ref 101–111)
CO2: 26 mmol/L (ref 22–32)
Calcium: 9 mg/dL (ref 8.9–10.3)
Creatinine, Ser: 1.16 mg/dL — ABNORMAL HIGH (ref 0.44–1.00)
GFR calc non Af Amer: >60 mL/min (ref >60)
Glucose, Bld: 88 mg/dL (ref 65–99)
POTASSIUM: 3.8 mmol/L (ref 3.5–5.1)
SODIUM: 136 mmol/L (ref 135–145)

## 2017-04-23 LAB — CBC WITH DIFFERENTIAL/PLATELET
Basophils Absolute: 0 10*3/uL (ref 0.0–0.1)
Basophils Relative: 0 %
Eosinophils Absolute: 0.1 10*3/uL (ref 0.0–0.7)
Eosinophils Relative: 1 %
HCT: 34.4 % — ABNORMAL LOW (ref 36.0–46.0)
HEMOGLOBIN: 11.2 g/dL — AB (ref 12.0–15.0)
LYMPHS ABS: 2 10*3/uL (ref 0.7–4.0)
LYMPHS PCT: 20 %
MCH: 27 pg (ref 26.0–34.0)
MCHC: 32.6 g/dL (ref 30.0–36.0)
MCV: 82.9 fL (ref 78.0–100.0)
Monocytes Absolute: 0.9 10*3/uL (ref 0.1–1.0)
Monocytes Relative: 9 %
NEUTROS PCT: 70 %
Neutro Abs: 6.7 10*3/uL (ref 1.7–7.7)
Platelets: 339 10*3/uL (ref 150–400)
RBC: 4.15 MIL/uL (ref 3.87–5.11)
RDW: 13.2 % (ref 11.5–15.5)
WBC: 9.8 10*3/uL (ref 4.0–10.5)

## 2017-04-23 LAB — URINALYSIS, ROUTINE W REFLEX MICROSCOPIC
Bilirubin Urine: NEGATIVE
GLUCOSE, UA: NEGATIVE mg/dL
Ketones, ur: NEGATIVE mg/dL
NITRITE: NEGATIVE
PH: 7 (ref 5.0–8.0)
Protein, ur: NEGATIVE mg/dL
SPECIFIC GRAVITY, URINE: 1.005 (ref 1.005–1.030)

## 2017-04-23 LAB — POC URINE PREG, ED: Preg Test, Ur: NEGATIVE

## 2017-04-23 MED ORDER — MORPHINE SULFATE 15 MG PO TABS: 7.5000 mg | ORAL_TABLET | ORAL | 0 refills | Status: DC | PRN

## 2017-04-23 MED ORDER — SODIUM CHLORIDE 0.9 % IV BOLUS (SEPSIS)
1000.0000 mL | Freq: Once | INTRAVENOUS | Status: AC
Start: 2017-04-23 — End: 2017-04-23
  Administered 2017-04-23: 1000 mL via INTRAVENOUS

## 2017-04-23 MED ORDER — MORPHINE SULFATE (PF) 4 MG/ML IV SOLN
4.0000 mg | Freq: Once | INTRAVENOUS | Status: AC
  Administered 2017-04-23: 4 mg via INTRAVENOUS
  Filled 2017-04-23: qty 1

## 2017-04-23 MED ORDER — KETOROLAC TROMETHAMINE 30 MG/ML IJ SOLN
15.0000 mg | Freq: Once | INTRAMUSCULAR | Status: AC
  Administered 2017-04-23: 15 mg via INTRAVENOUS
  Filled 2017-04-23: qty 1

## 2017-04-23 MED ORDER — IBUPROFEN 400 MG PO TABS: 400.0000 mg | ORAL_TABLET | Freq: Three times a day (TID) | ORAL | 0 refills | Status: AC

## 2017-04-23 NOTE — ED Notes (Signed)
ED Provider at bedside. 

## 2017-04-23 NOTE — Discharge Instructions (Signed)
As discussed, today's evaluation has been generally reassuring, but it is important that you discuss your kidney stone with your urology team tomorrow to ensure close outpatient follow-up. Return here for concerning changes in your condition.

## 2017-04-23 NOTE — ED Triage Notes (Signed)
Pt reports continued pain from L kidney stone that was diagnosed at the urogist office on Thursday. Pt reports stone was 5mm. No emesis.

## 2017-04-23 NOTE — ED Provider Notes (Signed)
[facility]-EMERGENCY DEPT Provider Note   CSN: [id number] Arrival date & time: 04/23/17  1052     History   Chief Complaint Chief Complaint  Patient presents with  . Nephrolithiasis    HPI Cheryl Collins is a 26 y.o. female.  HPI  Patient presents with concern of ongoing flank pain, nausea.  Patient has history of kidney stone, had evaluation in urology office 4 days ago, with CT demonstrating left-sided kidney stone, reportedly 5 mm. She was sent home with analgesia, Flomax, notes that she has had persistent pain in this area since the office visit. She denies any fever, vomiting, confusion, disorientation. Pain has changed minimally, nausea has changed minimally since the office visit and she presents for evaluation. She is here with her husband who assists with the HPI.   Past Medical History:  Diagnosis Date  . Hx of varicella   . Kidney stone   . Vaginal Pap smear, abnormal     Patient Active Problem List   Diagnosis Date Noted  . S/P cesarean section 09/26/2014  . Status post induction of labor 09/24/2014  . Hydronephrosis 05/27/2014    Past Surgical History:  Procedure Laterality Date  . CESAREAN SECTION N/A 09/26/2014   Procedure: CESAREAN SECTION;  Surgeon: Zelphia CairoGretchen Adkins, MD;  Location: [facility];  Service: Obstetrics;  Laterality: N/A;  . CESAREAN SECTION N/A 01/02/2017   Procedure: CESAREAN SECTION;  Surgeon: Mitchel HonourMorris, Megan, DO;  Location: [facility];  Service: Obstetrics;  Laterality: N/A;  REPEAT EDC 01/02/17 ALLERGY TO AMOXICILLIN, CECLOR NEED RNFA  . COLPOSCOPY    . CYSTOSCOPY W/ RETROGRADES Right 05/28/2014   Procedure: CYSTOSCOPY WITH RETROGRADE PYELOGRAM;  Surgeon: Sebastian Acheheodore Manny, MD;  Location: [facility];  Service: Urology;  Laterality: Right;  . CYSTOSCOPY WITH STENT PLACEMENT Right 05/28/2014   Procedure: CYSTOSCOPY WITH STENT PLACEMENT;  Surgeon: Sebastian Acheheodore Manny, MD;  Location: [facility];  Service: Urology;  Laterality: Right;  .  ear drum reconstruction    . PILONIDAL CYST EXCISION    . throat cystectomy    . TONSILLECTOMY    . URETEROSCOPY Right 05/28/2014   Procedure: URETEROSCOPY/STONE BASKET EXTRACTION;  Surgeon: Sebastian Acheheodore Manny, MD;  Location: [facility];  Service: Urology;  Laterality: Right;    OB History    Gravida Para Term Preterm AB Living   2 2 2     2    SAB TAB Ectopic Multiple Live Births         0 2       Home Medications    Prior to Admission medications   Medication Sig Start Date End Date Taking? Authorizing Provider  calcium carbonate (TUMS - DOSED IN MG ELEMENTAL CALCIUM) 500 MG chewable tablet Chew 2 tablets by mouth 3 (three) times daily as needed for indigestion or heartburn.    [provider]  ibuprofen (ADVIL,MOTRIN) 600 MG tablet Take 1 tablet (600 mg total) by mouth every 6 (six) hours. Patient not taking: Reported on 12/28/2016 09/28/14   Marcelle OverlieGrewal, Michelle, MD  oxyCODONE-acetaminophen (PERCOCET/ROXICET) 5-325 MG per tablet Take 1 tablet by mouth every 4 (four) hours as needed (for pain scale 4-7). Patient not taking: Reported on 12/28/2016 09/28/14   Marcelle OverlieGrewal, Michelle, MD  oxyCODONE-acetaminophen (PERCOCET/ROXICET) 5-325 MG tablet Take 2 tablets by mouth every 4 (four) hours as needed (pain scale > 7). 01/04/17   Marcelle OverlieGrewal, Michelle, MD  Prenatal Vit-Fe Fumarate-FA (PRENATAL MULTIVITAMIN) TABS tablet Take 1 tablet by mouth daily at 12 noon.    [provider]  ranitidine (ZANTAC) 150 MG tablet Take 150 mg by mouth 2 (two) times daily as needed for heartburn.    [provider]    Family History Family History  Problem Relation Age of Onset  . Diabetes Maternal Aunt   . Diabetes Maternal Grandmother   . Diabetes Paternal Grandmother   . Cancer Paternal Grandfather        bone  . Alcohol abuse Paternal Grandfather     Social History Social History   Tobacco Use  . Smoking status: Never Smoker  . Smokeless tobacco: Never Used  Substance Use Topics  . Alcohol  use: No  . Drug use: No     Allergies   Amoxil [amoxicillin] and Ceclor [cefaclor]   Review of Systems Review of Systems  Constitutional:       Per HPI, otherwise negative  HENT:       Per HPI, otherwise negative  Respiratory:       Per HPI, otherwise negative  Cardiovascular:       Per HPI, otherwise negative  Gastrointestinal: Negative for vomiting.  Endocrine:       Negative aside from HPI  Genitourinary:       Neg aside from HPI   Musculoskeletal:       Per HPI, otherwise negative  Skin: Negative.   Neurological: Negative for syncope.     Physical Exam Updated Vital Signs BP 126/70 (BP Location: Right Arm)   Pulse 72   Temp 98.3 F (36.8 C) (Oral)   Resp 16   SpO2 100%   Physical Exam  Constitutional: She is oriented to person, place, and time. She appears well-developed and well-nourished. No distress.  HENT:  Head: Normocephalic and atraumatic.  Eyes: Conjunctivae and EOM are normal.  Cardiovascular: Normal rate and regular rhythm.  Pulmonary/Chest: Effort normal and breath sounds normal. No stridor. No respiratory distress.  Abdominal: She exhibits no distension.  Musculoskeletal: She exhibits no edema.  Neurological: She is alert and oriented to person, place, and time. No cranial nerve deficit.  Skin: Skin is warm and dry.  Psychiatric: She has a normal mood and affect.  Nursing note and vitals reviewed.    ED Treatments / Results  Labs (all labs ordered are listed, but only abnormal results are displayed) Labs Reviewed  URINALYSIS, ROUTINE W REFLEX MICROSCOPIC - Abnormal; Notable for the following components:      Result Value   Color, Urine STRAW (*)    Hgb urine dipstick MODERATE (*)    Leukocytes, UA SMALL (*)    Bacteria, UA MANY (*)    Squamous Epithelial / LPF 6-30 (*)    All other components within normal limits  BASIC METABOLIC PANEL - Abnormal; Notable for the following components:   Creatinine, Ser 1.16 (*)    All other  components within normal limits  CBC WITH DIFFERENTIAL/PLATELET - Abnormal; Notable for the following components:   Hemoglobin 11.2 (*)    HCT 34.4 (*)    All other components within normal limits  POC URINE PREG, ED   Radiology  Radiology imaging from CT scan 4 days ago not available  KUB performed today with possible left distal ureteral stone.    Procedures Procedures (including critical care time)  Medications Ordered in ED Medications  sodium chloride 0.9 % bolus 1,000 mL (0 mLs Intravenous Stopped 04/23/17 1507)  ketorolac (TORADOL) 30 MG/ML injection 15 mg (15 mg Intravenous Given 04/23/17 1401)  morphine 4 MG/ML injection 4 mg (4  mg Intravenous Given 04/23/17 1358)     Initial Impression / Assessment and Plan / ED Course  I have reviewed the triage vital signs and the nursing notes.  Pertinent labs & imaging results that were available during my care of the patient were reviewed by me and considered in my medical decision making (see chart for details).   Chart review notable for demonstration of history of kidney stones, prior intervention.  Update: Patient substantially better, ambulatory, in no distress per Findings reassuring, no evidence for infection, obstruction. Given her improved pain control, patient was discharged in stable condition to follow-up with urology office, tomorrow.   Final Clinical Impressions(s) / ED Diagnoses   Final diagnoses:  Nephrolithiasis    ED Discharge Orders        Ordered    ibuprofen (ADVIL,MOTRIN) 400 MG tablet  3 times daily     04/23/17 1457    morphine (MSIR) 15 MG tablet  Every 4 hours PRN     04/23/17 1457       Gerhard Munch, MD 04/23/17 1555

## 2017-04-27 DIAGNOSIS — Z713 Dietary counseling and surveillance: Secondary | ICD-10-CM | POA: Diagnosis not present

## 2017-04-27 DIAGNOSIS — Z6830 Body mass index (BMI) 30.0-30.9, adult: Secondary | ICD-10-CM | POA: Diagnosis not present

## 2017-04-27 DIAGNOSIS — N200 Calculus of kidney: Secondary | ICD-10-CM | POA: Diagnosis not present

## 2017-05-29 DIAGNOSIS — Z6829 Body mass index (BMI) 29.0-29.9, adult: Secondary | ICD-10-CM | POA: Diagnosis not present

## 2017-05-29 DIAGNOSIS — Z713 Dietary counseling and surveillance: Secondary | ICD-10-CM | POA: Diagnosis not present

## 2017-05-29 DIAGNOSIS — H6691 Otitis media, unspecified, right ear: Secondary | ICD-10-CM | POA: Diagnosis not present

## 2017-05-29 DIAGNOSIS — R05 Cough: Secondary | ICD-10-CM | POA: Diagnosis not present

## 2017-07-11 DIAGNOSIS — Z713 Dietary counseling and surveillance: Secondary | ICD-10-CM | POA: Diagnosis not present

## 2017-07-11 DIAGNOSIS — Z789 Other specified health status: Secondary | ICD-10-CM | POA: Diagnosis not present

## 2017-07-11 DIAGNOSIS — Z7189 Other specified counseling: Secondary | ICD-10-CM | POA: Diagnosis not present

## 2017-07-11 DIAGNOSIS — Z6829 Body mass index (BMI) 29.0-29.9, adult: Secondary | ICD-10-CM | POA: Diagnosis not present

## 2018-03-06 DIAGNOSIS — Z01419 Encounter for gynecological examination (general) (routine) without abnormal findings: Secondary | ICD-10-CM | POA: Diagnosis not present

## 2018-03-06 DIAGNOSIS — Z6828 Body mass index (BMI) 28.0-28.9, adult: Secondary | ICD-10-CM | POA: Diagnosis not present

## 2018-03-21 DIAGNOSIS — J069 Acute upper respiratory infection, unspecified: Secondary | ICD-10-CM | POA: Diagnosis not present

## 2018-05-03 DIAGNOSIS — Z6828 Body mass index (BMI) 28.0-28.9, adult: Secondary | ICD-10-CM | POA: Diagnosis not present

## 2018-05-03 DIAGNOSIS — J069 Acute upper respiratory infection, unspecified: Secondary | ICD-10-CM | POA: Diagnosis not present

## 2018-05-03 DIAGNOSIS — Z1331 Encounter for screening for depression: Secondary | ICD-10-CM | POA: Diagnosis not present

## 2018-05-20 DIAGNOSIS — J209 Acute bronchitis, unspecified: Secondary | ICD-10-CM | POA: Diagnosis not present

## 2018-07-19 DIAGNOSIS — Z13220 Encounter for screening for lipoid disorders: Secondary | ICD-10-CM | POA: Diagnosis not present

## 2018-07-19 DIAGNOSIS — F419 Anxiety disorder, unspecified: Secondary | ICD-10-CM | POA: Diagnosis not present

## 2018-07-19 DIAGNOSIS — Z1329 Encounter for screening for other suspected endocrine disorder: Secondary | ICD-10-CM | POA: Diagnosis not present

## 2018-07-19 DIAGNOSIS — J309 Allergic rhinitis, unspecified: Secondary | ICD-10-CM | POA: Diagnosis not present

## 2018-07-31 DIAGNOSIS — Z13220 Encounter for screening for lipoid disorders: Secondary | ICD-10-CM | POA: Diagnosis not present

## 2018-07-31 DIAGNOSIS — Z1329 Encounter for screening for other suspected endocrine disorder: Secondary | ICD-10-CM | POA: Diagnosis not present

## 2018-08-09 DIAGNOSIS — F419 Anxiety disorder, unspecified: Secondary | ICD-10-CM | POA: Diagnosis not present

## 2018-09-10 DIAGNOSIS — Z713 Dietary counseling and surveillance: Secondary | ICD-10-CM | POA: Diagnosis not present

## 2018-09-10 DIAGNOSIS — Z6829 Body mass index (BMI) 29.0-29.9, adult: Secondary | ICD-10-CM | POA: Diagnosis not present

## 2018-09-17 DIAGNOSIS — K089 Disorder of teeth and supporting structures, unspecified: Secondary | ICD-10-CM | POA: Diagnosis not present

## 2018-09-17 DIAGNOSIS — J3489 Other specified disorders of nose and nasal sinuses: Secondary | ICD-10-CM | POA: Diagnosis not present

## 2018-09-17 DIAGNOSIS — H9201 Otalgia, right ear: Secondary | ICD-10-CM | POA: Diagnosis not present

## 2018-10-02 DIAGNOSIS — Z713 Dietary counseling and surveillance: Secondary | ICD-10-CM | POA: Diagnosis not present

## 2018-10-02 DIAGNOSIS — Z6829 Body mass index (BMI) 29.0-29.9, adult: Secondary | ICD-10-CM | POA: Diagnosis not present

## 2018-12-24 DIAGNOSIS — T7840XA Allergy, unspecified, initial encounter: Secondary | ICD-10-CM | POA: Diagnosis not present

## 2018-12-24 DIAGNOSIS — T450X5A Adverse effect of antiallergic and antiemetic drugs, initial encounter: Secondary | ICD-10-CM | POA: Diagnosis not present

## 2019-04-10 DIAGNOSIS — Z01419 Encounter for gynecological examination (general) (routine) without abnormal findings: Secondary | ICD-10-CM | POA: Diagnosis not present

## 2019-04-10 DIAGNOSIS — Z6830 Body mass index (BMI) 30.0-30.9, adult: Secondary | ICD-10-CM | POA: Diagnosis not present

## 2020-04-23 LAB — OB RESULTS CONSOLE RUBELLA ANTIBODY, IGM: Rubella: IMMUNE

## 2020-04-23 LAB — OB RESULTS CONSOLE HIV ANTIBODY (ROUTINE TESTING): HIV: NONREACTIVE

## 2020-04-23 LAB — OB RESULTS CONSOLE GC/CHLAMYDIA
Chlamydia: NEGATIVE
Gonorrhea: NEGATIVE

## 2020-04-23 LAB — OB RESULTS CONSOLE ANTIBODY SCREEN: Antibody Screen: NEGATIVE

## 2020-04-23 LAB — OB RESULTS CONSOLE RPR: RPR: NONREACTIVE

## 2020-04-23 LAB — OB RESULTS CONSOLE HEPATITIS B SURFACE ANTIGEN: Hepatitis B Surface Ag: NEGATIVE

## 2020-04-23 LAB — OB RESULTS CONSOLE ABO/RH: RH Type: POSITIVE

## 2020-11-17 ENCOUNTER — Encounter (HOSPITAL_COMMUNITY): Payer: Self-pay | Admitting: *Deleted

## 2020-11-17 NOTE — Patient Instructions (Addendum)
Cheryl Collins  11/17/2020   Your procedure is scheduled on:  11/26/2020  Arrive at 0530 at Entrance C on [employer] [address] at [facility]  and [address]. You are invited to use the FREE valet parking or use the Visitor's parking deck.  Pick up the phone at the desk and dial [phone].  Call this number if you have problems the morning of surgery: [phone]  Remember:   Do not eat food:(After Midnight) Desps de medianoche.  Do not drink clear liquids: (After Midnight) Desps de medianoche.  Take these medicines the morning of surgery with A SIP OF WATER:  May take prilosec   Do not wear jewelry, make-up or nail polish.  Do not wear lotions, powders, or perfumes. Do not wear deodorant.  Do not shave 48 hours prior to surgery.  Do not bring valuables to the hospital.  [facility] is not   responsible for any belongings or valuables brought to the hospital.  Contacts, dentures or bridgework may not be worn into surgery.  Leave suitcase in the car. After surgery it may be brought to your room.  For patients admitted to the hospital, checkout time is 11:00 AM the day of              discharge.      Please read over the following fact sheets that you were given:     Preparing for Surgery

## 2020-11-19 ENCOUNTER — Encounter (HOSPITAL_COMMUNITY): Payer: Self-pay

## 2020-11-20 NOTE — H&P (Signed)
Cheryl Collins is a 30 y.o. female G3P2002 at 57 weeks with history of previous C/S x 2 and desires for permanent sterility presenting for repeat C/S and BTL.  Antepartum course uncomplicated.  GBS negative.  OB History     Gravida  3   Para  2   Term  2   Preterm      AB      Living  2      SAB      IAB      Ectopic      Multiple  0   Live Births  2          Past Medical History:  Diagnosis Date   Hx of varicella    Kidney stone    Vaginal Pap smear, abnormal    Past Surgical History:  Procedure Laterality Date   CESAREAN SECTION N/A 09/26/2014   Procedure: CESAREAN SECTION;  Surgeon: Zelphia Cairo, MD;  Location: [facility];  Service: Obstetrics;  Laterality: N/A;   CESAREAN SECTION N/A 01/02/2017   Procedure: CESAREAN SECTION;  Surgeon: Mitchel Honour, DO;  Location: [facility];  Service: Obstetrics;  Laterality: N/A;  REPEAT EDC 01/02/17 ALLERGY TO AMOXICILLIN, CECLOR NEED RNFA   COLPOSCOPY     CYSTOSCOPY W/ RETROGRADES Right 05/28/2014   Procedure: CYSTOSCOPY WITH RETROGRADE PYELOGRAM;  Surgeon: Sebastian Ache, MD;  Location: [facility];  Service: Urology;  Laterality: Right;   CYSTOSCOPY WITH STENT PLACEMENT Right 05/28/2014   Procedure: CYSTOSCOPY WITH STENT PLACEMENT;  Surgeon: Sebastian Ache, MD;  Location: [facility];  Service: Urology;  Laterality: Right;   ear drum reconstruction     PILONIDAL CYST EXCISION     throat cystectomy     TONSILLECTOMY     URETEROSCOPY Right 05/28/2014   Procedure: URETEROSCOPY/STONE BASKET EXTRACTION;  Surgeon: Sebastian Ache, MD;  Location: [facility];  Service: Urology;  Laterality: Right;   Family History: family history includes Alcohol abuse in her paternal grandfather; Cancer in her paternal grandfather; Diabetes in her maternal aunt, maternal grandmother, and paternal grandmother. Social History:  reports that she has never smoked. She has never used smokeless tobacco. She reports that she does not drink alcohol and does not use  drugs.     Maternal Diabetes: No Genetic Screening: Normal Maternal Ultrasounds/Referrals: Normal Fetal Ultrasounds or other Referrals:  None Maternal Substance Abuse:  No Significant Maternal Medications:  None Significant Maternal Lab Results:  Group B Strep negative Other Comments:  None  Review of Systems Maternal Medical History:  Prenatal complications: no prenatal complications Prenatal Complications - Diabetes: none.    unknown if currently breastfeeding. Maternal Exam:  Abdomen: Patient reports no abdominal tenderness. Surgical scars: low transverse.   Fundal height is c/w dates.   Estimated fetal weight is 8#.    Physical Exam Constitutional:      Appearance: Normal appearance.  HENT:     Head: Normocephalic and atraumatic.  Pulmonary:     Effort: Pulmonary effort is normal.  Abdominal:     Palpations: Abdomen is soft.  Musculoskeletal:        General: Normal range of motion.     Cervical back: Normal range of motion.  Neurological:     Mental Status: She is alert and oriented to person, place, and time.  Psychiatric:        Mood and Affect: Mood normal.        Behavior: Behavior normal.    Prenatal labs: ABO, Rh: O/Positive/-- (12/02 0000) Antibody: Negative (  12/02 0000) Rubella: Immune (12/02 0000) RPR: Nonreactive (12/02 0000)  HBsAg: Negative (12/02 0000)  HIV: Non-reactive (12/02 0000)  GBS:   Negative  Assessment/Plan: 30yo G3P2002 at 39 weeks with previous C/S x 2 and desire for sterility -Repeat C/S with BTL -Patient has been counseled re: risk of bleeding, infection, scarring, and damage to surrounding structures.  She is informed of risk of failure, permanence and regret with BTL.  All questions have been answered and patient wishes to proceed.   Mitchel Honour 11/20/2020, 6:30 PM

## 2020-11-24 ENCOUNTER — Other Ambulatory Visit (HOSPITAL_COMMUNITY)
Admission: RE | Admit: 2020-11-24 | Discharge: 2020-11-24 | Disposition: A | Discharge status: Home / Self Care | Payer: BC Managed Care – PPO | Source: Ambulatory Visit | Attending: Obstetrics & Gynecology | Admitting: Obstetrics & Gynecology

## 2020-11-24 ENCOUNTER — Other Ambulatory Visit: Payer: Self-pay

## 2020-11-24 ENCOUNTER — Encounter (HOSPITAL_COMMUNITY)
Admission: RE | Admit: 2020-11-24 | Discharge: 2020-11-24 | Disposition: A | Discharge status: Home / Self Care | Payer: BC Managed Care – PPO | Source: Ambulatory Visit | Attending: Obstetrics & Gynecology | Admitting: Obstetrics & Gynecology

## 2020-11-24 DIAGNOSIS — Z20822 Contact with and (suspected) exposure to covid-19: Secondary | ICD-10-CM | POA: Insufficient documentation

## 2020-11-24 DIAGNOSIS — Z01812 Encounter for preprocedural laboratory examination: Secondary | ICD-10-CM | POA: Insufficient documentation

## 2020-11-24 LAB — CBC
HCT: 35.1 % — ABNORMAL LOW (ref 36.0–46.0)
Hemoglobin: 11.4 g/dL — ABNORMAL LOW (ref 12.0–15.0)
MCH: 28.2 pg (ref 26.0–34.0)
MCHC: 32.5 g/dL (ref 30.0–36.0)
MCV: 86.9 fL (ref 80.0–100.0)
Platelets: 300 10*3/uL (ref 150–400)
RBC: 4.04 MIL/uL (ref 3.87–5.11)
RDW: 13.3 % (ref 11.5–15.5)
WBC: 9.2 10*3/uL (ref 4.0–10.5)
nRBC: 0 % (ref 0.0–0.2)

## 2020-11-24 LAB — TYPE AND SCREEN
ABO/RH(D): O POS
Antibody Screen: NEGATIVE

## 2020-11-24 LAB — SARS CORONAVIRUS 2 (TAT 6-24 HRS): SARS Coronavirus 2: NEGATIVE

## 2020-11-25 ENCOUNTER — Encounter (HOSPITAL_COMMUNITY): Payer: Self-pay | Admitting: Obstetrics & Gynecology

## 2020-11-25 LAB — RPR: RPR Ser Ql: NONREACTIVE

## 2020-11-26 ENCOUNTER — Inpatient Hospital Stay (HOSPITAL_COMMUNITY): Payer: BC Managed Care – PPO | Admitting: Anesthesiology

## 2020-11-26 ENCOUNTER — Encounter (HOSPITAL_COMMUNITY): Payer: Self-pay | Admitting: Obstetrics & Gynecology

## 2020-11-26 ENCOUNTER — Other Ambulatory Visit: Payer: Self-pay

## 2020-11-26 ENCOUNTER — Inpatient Hospital Stay (HOSPITAL_COMMUNITY)
Admission: RE | Admit: 2020-11-26 | Discharge: 2020-11-28 | DRG: 785 | Disposition: A | Discharge status: Home / Self Care | Payer: BC Managed Care – PPO | Attending: Obstetrics & Gynecology | Admitting: Obstetrics & Gynecology

## 2020-11-26 ENCOUNTER — Encounter (HOSPITAL_COMMUNITY)
Admission: RE | Disposition: A | Discharge status: Home / Self Care | Payer: Self-pay | Source: Home / Self Care | Attending: Obstetrics & Gynecology

## 2020-11-26 DIAGNOSIS — Z20822 Contact with and (suspected) exposure to covid-19: Secondary | ICD-10-CM | POA: Diagnosis present

## 2020-11-26 DIAGNOSIS — Z3A39 39 weeks gestation of pregnancy: Secondary | ICD-10-CM | POA: Diagnosis not present

## 2020-11-26 DIAGNOSIS — Z98891 History of uterine scar from previous surgery: Secondary | ICD-10-CM

## 2020-11-26 DIAGNOSIS — Z302 Encounter for sterilization: Secondary | ICD-10-CM | POA: Diagnosis not present

## 2020-11-26 DIAGNOSIS — Z23 Encounter for immunization: Secondary | ICD-10-CM

## 2020-11-26 DIAGNOSIS — O34211 Maternal care for low transverse scar from previous cesarean delivery: Principal | ICD-10-CM | POA: Diagnosis present

## 2020-11-26 SURGERY — Surgical Case
Anesthesia: Spinal | Laterality: Bilateral

## 2020-11-26 MED ORDER — PHENYLEPHRINE 40 MCG/ML (10ML) SYRINGE FOR IV PUSH (FOR BLOOD PRESSURE SUPPORT)
PREFILLED_SYRINGE | INTRAVENOUS | Status: AC
  Filled 2020-11-26: qty 10

## 2020-11-26 MED ORDER — ONDANSETRON HCL 4 MG/2ML IJ SOLN
INTRAMUSCULAR | Status: DC | PRN
  Administered 2020-11-26: 4 mg via INTRAVENOUS

## 2020-11-26 MED ORDER — NALBUPHINE HCL 10 MG/ML IJ SOLN: 5.0000 mg | INTRAMUSCULAR | Status: DC | PRN

## 2020-11-26 MED ORDER — DIBUCAINE (PERIANAL) 1 % EX OINT: 1.0000 "application " | TOPICAL_OINTMENT | CUTANEOUS | Status: DC | PRN

## 2020-11-26 MED ORDER — PHENYLEPHRINE HCL-NACL 20-0.9 MG/250ML-% IV SOLN
INTRAVENOUS | Status: DC | PRN
  Administered 2020-11-26: 60 ug/min via INTRAVENOUS

## 2020-11-26 MED ORDER — PHENYLEPHRINE HCL-NACL 20-0.9 MG/250ML-% IV SOLN
INTRAVENOUS | Status: AC
  Filled 2020-11-26: qty 250

## 2020-11-26 MED ORDER — BUPIVACAINE IN DEXTROSE 0.75-8.25 % IT SOLN
INTRATHECAL | Status: DC | PRN
  Administered 2020-11-26: 1.6 mL via INTRATHECAL

## 2020-11-26 MED ORDER — IBUPROFEN 800 MG PO TABS: 800.0000 mg | ORAL_TABLET | Freq: Four times a day (QID) | ORAL | Status: DC

## 2020-11-26 MED ORDER — NALBUPHINE HCL 10 MG/ML IJ SOLN: 5.0000 mg | Freq: Once | INTRAMUSCULAR | Status: AC | PRN

## 2020-11-26 MED ORDER — WITCH HAZEL-GLYCERIN EX PADS: 1.0000 "application " | MEDICATED_PAD | CUTANEOUS | Status: DC | PRN

## 2020-11-26 MED ORDER — LACTATED RINGERS IV SOLN: INTRAVENOUS | Status: DC | PRN

## 2020-11-26 MED ORDER — SODIUM CHLORIDE 0.9% FLUSH: 3.0000 mL | INTRAVENOUS | Status: DC | PRN

## 2020-11-26 MED ORDER — LACTATED RINGERS IV SOLN: INTRAVENOUS | Status: DC

## 2020-11-26 MED ORDER — CLINDAMYCIN PHOSPHATE 900 MG/50ML IV SOLN
INTRAVENOUS | Status: AC
  Filled 2020-11-26: qty 50

## 2020-11-26 MED ORDER — NALBUPHINE HCL 10 MG/ML IJ SOLN
5.0000 mg | Freq: Once | INTRAMUSCULAR | Status: AC | PRN
  Administered 2020-11-26: 5 mg via INTRAVENOUS

## 2020-11-26 MED ORDER — ACETAMINOPHEN 10 MG/ML IV SOLN
INTRAVENOUS | Status: AC
  Filled 2020-11-26: qty 100

## 2020-11-26 MED ORDER — SCOPOLAMINE 1 MG/3DAYS TD PT72
MEDICATED_PATCH | TRANSDERMAL | Status: DC | PRN
  Administered 2020-11-26: 1 via TRANSDERMAL

## 2020-11-26 MED ORDER — SCOPOLAMINE 1 MG/3DAYS TD PT72
MEDICATED_PATCH | TRANSDERMAL | Status: AC
  Filled 2020-11-26: qty 1

## 2020-11-26 MED ORDER — SENNOSIDES-DOCUSATE SODIUM 8.6-50 MG PO TABS
2.0000 | ORAL_TABLET | Freq: Every day | ORAL | Status: DC
  Administered 2020-11-27 – 2020-11-28 (×2): 2 via ORAL
  Filled 2020-11-26 (×2): qty 2

## 2020-11-26 MED ORDER — DIPHENHYDRAMINE HCL 50 MG/ML IJ SOLN
12.5000 mg | INTRAMUSCULAR | Status: DC | PRN
  Administered 2020-11-26: 12.5 mg via INTRAVENOUS
  Filled 2020-11-26: qty 1

## 2020-11-26 MED ORDER — MORPHINE SULFATE (PF) 0.5 MG/ML IJ SOLN
INTRAMUSCULAR | Status: DC | PRN
  Administered 2020-11-26: 150 ug via INTRATHECAL

## 2020-11-26 MED ORDER — SIMETHICONE 80 MG PO CHEW: 80.0000 mg | CHEWABLE_TABLET | ORAL | Status: DC | PRN

## 2020-11-26 MED ORDER — MORPHINE SULFATE (PF) 0.5 MG/ML IJ SOLN
INTRAMUSCULAR | Status: AC
  Filled 2020-11-26: qty 10

## 2020-11-26 MED ORDER — DEXAMETHASONE SODIUM PHOSPHATE 4 MG/ML IJ SOLN
INTRAMUSCULAR | Status: DC | PRN
  Administered 2020-11-26: 8 mg via INTRAVENOUS

## 2020-11-26 MED ORDER — FENTANYL CITRATE (PF) 100 MCG/2ML IJ SOLN
INTRAMUSCULAR | Status: AC
  Filled 2020-11-26: qty 2

## 2020-11-26 MED ORDER — SCOPOLAMINE 1 MG/3DAYS TD PT72: 1.0000 | MEDICATED_PATCH | Freq: Once | TRANSDERMAL | Status: DC

## 2020-11-26 MED ORDER — KETOROLAC TROMETHAMINE 30 MG/ML IJ SOLN
30.0000 mg | Freq: Once | INTRAMUSCULAR | Status: AC | PRN
  Administered 2020-11-26: 30 mg via INTRAVENOUS

## 2020-11-26 MED ORDER — ZOLPIDEM TARTRATE 5 MG PO TABS: 5.0000 mg | ORAL_TABLET | Freq: Every evening | ORAL | Status: DC | PRN

## 2020-11-26 MED ORDER — DIPHENHYDRAMINE HCL 25 MG PO CAPS
25.0000 mg | ORAL_CAPSULE | ORAL | Status: DC | PRN
  Filled 2020-11-26: qty 1

## 2020-11-26 MED ORDER — POVIDONE-IODINE 10 % EX SWAB: 2.0000 "application " | Freq: Once | CUTANEOUS | Status: DC

## 2020-11-26 MED ORDER — NALBUPHINE HCL 10 MG/ML IJ SOLN
5.0000 mg | INTRAMUSCULAR | Status: DC | PRN
  Filled 2020-11-26: qty 1

## 2020-11-26 MED ORDER — ONDANSETRON HCL 4 MG/2ML IJ SOLN: 4.0000 mg | Freq: Three times a day (TID) | INTRAMUSCULAR | Status: DC | PRN

## 2020-11-26 MED ORDER — OXYTOCIN-SODIUM CHLORIDE 30-0.9 UT/500ML-% IV SOLN
INTRAVENOUS | Status: AC
  Filled 2020-11-26: qty 500

## 2020-11-26 MED ORDER — ACETAMINOPHEN 10 MG/ML IV SOLN
INTRAVENOUS | Status: DC | PRN
  Administered 2020-11-26: 1000 mg via INTRAVENOUS

## 2020-11-26 MED ORDER — NALOXONE HCL 0.4 MG/ML IJ SOLN: 0.4000 mg | INTRAMUSCULAR | Status: DC | PRN

## 2020-11-26 MED ORDER — GENTAMICIN SULFATE 40 MG/ML IJ SOLN
5.0000 mg/kg | INTRAVENOUS | Status: AC
  Administered 2020-11-26: 360 mg via INTRAVENOUS
  Filled 2020-11-26: qty 9

## 2020-11-26 MED ORDER — OXYCODONE HCL 5 MG/5ML PO SOLN: 5.0000 mg | Freq: Once | ORAL | Status: DC | PRN

## 2020-11-26 MED ORDER — NALOXONE HCL 4 MG/10ML IJ SOLN
1.0000 ug/kg/h | INTRAVENOUS | Status: DC | PRN
  Filled 2020-11-26: qty 5

## 2020-11-26 MED ORDER — COCONUT OIL OIL
1.0000 "application " | TOPICAL_OIL | Status: DC | PRN
  Administered 2020-11-27: 1 via TOPICAL

## 2020-11-26 MED ORDER — IBUPROFEN 800 MG PO TABS
800.0000 mg | ORAL_TABLET | Freq: Four times a day (QID) | ORAL | Status: DC
  Administered 2020-11-26 – 2020-11-28 (×8): 800 mg via ORAL
  Filled 2020-11-26 (×9): qty 1

## 2020-11-26 MED ORDER — HYDROMORPHONE HCL 1 MG/ML IJ SOLN: 0.2500 mg | INTRAMUSCULAR | Status: DC | PRN

## 2020-11-26 MED ORDER — SIMETHICONE 80 MG PO CHEW
80.0000 mg | CHEWABLE_TABLET | Freq: Three times a day (TID) | ORAL | Status: DC
  Administered 2020-11-26 – 2020-11-28 (×5): 80 mg via ORAL
  Filled 2020-11-26 (×5): qty 1

## 2020-11-26 MED ORDER — FENTANYL CITRATE (PF) 100 MCG/2ML IJ SOLN
INTRAMUSCULAR | Status: DC | PRN
  Administered 2020-11-26: 15 ug via INTRATHECAL

## 2020-11-26 MED ORDER — PRENATAL MULTIVITAMIN CH
1.0000 | ORAL_TABLET | Freq: Every day | ORAL | Status: DC
  Administered 2020-11-26 – 2020-11-27 (×2): 1 via ORAL
  Filled 2020-11-26 (×2): qty 1

## 2020-11-26 MED ORDER — OXYCODONE HCL 5 MG PO TABS
5.0000 mg | ORAL_TABLET | Freq: Once | ORAL | Status: DC | PRN
Start: 2020-11-26 — End: 2020-11-26

## 2020-11-26 MED ORDER — CLINDAMYCIN PHOSPHATE 900 MG/50ML IV SOLN
900.0000 mg | INTRAVENOUS | Status: AC
  Administered 2020-11-26: 900 mg via INTRAVENOUS

## 2020-11-26 MED ORDER — MENTHOL 3 MG MT LOZG: 1.0000 | LOZENGE | OROMUCOSAL | Status: DC | PRN

## 2020-11-26 MED ORDER — ONDANSETRON HCL 4 MG/2ML IJ SOLN
INTRAMUSCULAR | Status: AC
  Filled 2020-11-26: qty 2

## 2020-11-26 MED ORDER — KETOROLAC TROMETHAMINE 30 MG/ML IJ SOLN
INTRAMUSCULAR | Status: AC
  Filled 2020-11-26: qty 1

## 2020-11-26 MED ORDER — DIPHENHYDRAMINE HCL 25 MG PO CAPS
25.0000 mg | ORAL_CAPSULE | Freq: Four times a day (QID) | ORAL | Status: DC | PRN
  Administered 2020-11-27: 25 mg via ORAL

## 2020-11-26 MED ORDER — METOCLOPRAMIDE HCL 5 MG/ML IJ SOLN
INTRAMUSCULAR | Status: AC
  Filled 2020-11-26: qty 2

## 2020-11-26 MED ORDER — OXYCODONE HCL 5 MG PO TABS
5.0000 mg | ORAL_TABLET | ORAL | Status: DC | PRN
Start: 2020-11-26 — End: 2020-11-28
  Administered 2020-11-27 – 2020-11-28 (×2): 5 mg via ORAL
  Filled 2020-11-26 (×2): qty 1

## 2020-11-26 MED ORDER — DEXAMETHASONE SODIUM PHOSPHATE 4 MG/ML IJ SOLN
INTRAMUSCULAR | Status: AC
  Filled 2020-11-26: qty 2

## 2020-11-26 MED ORDER — OXYTOCIN-SODIUM CHLORIDE 30-0.9 UT/500ML-% IV SOLN
INTRAVENOUS | Status: DC | PRN
  Administered 2020-11-26: 300 mL via INTRAVENOUS

## 2020-11-26 MED ORDER — PROMETHAZINE HCL 25 MG/ML IJ SOLN: 6.2500 mg | INTRAMUSCULAR | Status: DC | PRN

## 2020-11-26 MED ORDER — ACETAMINOPHEN 500 MG PO TABS
1000.0000 mg | ORAL_TABLET | Freq: Four times a day (QID) | ORAL | Status: DC
  Administered 2020-11-26 – 2020-11-28 (×8): 1000 mg via ORAL
  Filled 2020-11-26 (×9): qty 2

## 2020-11-26 MED ORDER — TETANUS-DIPHTH-ACELL PERTUSSIS 5-2.5-18.5 LF-MCG/0.5 IM SUSY
0.5000 mL | PREFILLED_SYRINGE | Freq: Once | INTRAMUSCULAR | Status: AC
  Administered 2020-11-28: 0.5 mL via INTRAMUSCULAR
  Filled 2020-11-26: qty 0.5

## 2020-11-26 MED ORDER — MEPERIDINE HCL 25 MG/ML IJ SOLN: 6.2500 mg | INTRAMUSCULAR | Status: DC | PRN

## 2020-11-26 MED ORDER — OXYTOCIN-SODIUM CHLORIDE 30-0.9 UT/500ML-% IV SOLN: 2.5000 [IU]/h | INTRAVENOUS | Status: AC

## 2020-11-26 SURGICAL SUPPLY — 33 items
BENZOIN TINCTURE PRP APPL 2/3 (GAUZE/BANDAGES/DRESSINGS) ×2 IMPLANT
CHLORAPREP W/TINT 26 (MISCELLANEOUS) ×2 IMPLANT
CLAMP CORD UMBIL (MISCELLANEOUS) IMPLANT
CLOSURE STERI STRIP 1/2 X4 (GAUZE/BANDAGES/DRESSINGS) ×2 IMPLANT
CLOTH BEACON ORANGE TIMEOUT ST (SAFETY) ×2 IMPLANT
DERMABOND ADVANCED (GAUZE/BANDAGES/DRESSINGS)
DERMABOND ADVANCED .7 DNX12 (GAUZE/BANDAGES/DRESSINGS) IMPLANT
DRSG OPSITE POSTOP 4X10 (GAUZE/BANDAGES/DRESSINGS) ×2 IMPLANT
ELECT REM PT RETURN 9FT ADLT (ELECTROSURGICAL) ×2
ELECTRODE REM PT RTRN 9FT ADLT (ELECTROSURGICAL) ×1 IMPLANT
EXTRACTOR VACUUM KIWI (MISCELLANEOUS) IMPLANT
GLOVE BIO SURGEON STRL SZ 6 (GLOVE) ×2 IMPLANT
GLOVE BIOGEL PI IND STRL 6 (GLOVE) ×2 IMPLANT
GLOVE BIOGEL PI IND STRL 7.0 (GLOVE) ×1 IMPLANT
GLOVE BIOGEL PI INDICATOR 6 (GLOVE) ×2
GLOVE BIOGEL PI INDICATOR 7.0 (GLOVE) ×1
GOWN STRL REUS W/TWL LRG LVL3 (GOWN DISPOSABLE) ×4 IMPLANT
KIT ABG SYR 3ML LUER SLIP (SYRINGE) ×2 IMPLANT
NEEDLE HYPO 25X5/8 SAFETYGLIDE (NEEDLE) ×2 IMPLANT
NS IRRIG 1000ML POUR BTL (IV SOLUTION) ×2 IMPLANT
PACK C SECTION WH (CUSTOM PROCEDURE TRAY) ×2 IMPLANT
PAD OB MATERNITY 4.3X12.25 (PERSONAL CARE ITEMS) ×2 IMPLANT
PENCIL SMOKE EVAC W/HOLSTER (ELECTROSURGICAL) ×2 IMPLANT
STRIP CLOSURE SKIN 1/2X4 (GAUZE/BANDAGES/DRESSINGS) IMPLANT
SUT CHROMIC 0 CTX 36 (SUTURE) ×6 IMPLANT
SUT MON AB 2-0 CT1 27 (SUTURE) ×2 IMPLANT
SUT PDS AB 0 CT1 27 (SUTURE) IMPLANT
SUT PLAIN 0 NONE (SUTURE) IMPLANT
SUT VIC AB 0 CT1 36 (SUTURE) IMPLANT
SUT VIC AB 4-0 KS 27 (SUTURE) IMPLANT
TOWEL OR 17X24 6PK STRL BLUE (TOWEL DISPOSABLE) ×2 IMPLANT
TRAY FOLEY W/BAG SLVR 14FR LF (SET/KITS/TRAYS/PACK) IMPLANT
WATER STERILE IRR 1000ML POUR (IV SOLUTION) ×2 IMPLANT

## 2020-11-26 NOTE — Progress Notes (Signed)
No change to H&P.  Aida Lemaire, DO 

## 2020-11-26 NOTE — Anesthesia Preprocedure Evaluation (Signed)
Anesthesia Evaluation  Patient identified by MRN, date of birth, ID band Patient awake    Reviewed: Allergy & Precautions, NPO status , Patient's Chart, lab work & pertinent test results  Airway Mallampati: I  TM Distance: >3 FB Neck ROM: Full    Dental  (+) Teeth Intact, Dental Advisory Given   Pulmonary neg pulmonary ROS,    breath sounds clear to auscultation       Cardiovascular negative cardio ROS   Rhythm:Regular Rate:Normal     Neuro/Psych negative neurological ROS  negative psych ROS   GI/Hepatic negative GI ROS, Neg liver ROS,   Endo/Other  negative endocrine ROS  Renal/GU      Musculoskeletal negative musculoskeletal ROS (+)   Abdominal (+) + obese,   Peds  Hematology negative hematology ROS (+)   Anesthesia Other Findings Day of surgery medications reviewed with the patient.  Reproductive/Obstetrics (+) Pregnancy                             Lab Results  Component Value Date   WBC 9.2 11/24/2020   HGB 11.4 (L) 11/24/2020   HCT 35.1 (L) 11/24/2020   MCV 86.9 11/24/2020   PLT 300 11/24/2020     Anesthesia Physical  Anesthesia Plan  ASA: II  Anesthesia Plan: Spinal   Post-op Pain Management:    Induction:   PONV Risk Score and Plan:   Airway Management Planned: Natural Airway  Additional Equipment:   Intra-op Plan:   Post-operative Plan:   Informed Consent:   Plan Discussed with:   Anesthesia Plan Comments:         Anesthesia Quick Evaluation

## 2020-11-26 NOTE — Anesthesia Procedure Notes (Signed)
Spinal  Patient location during procedure: OB Start time: 11/26/2020 7:23 AM End time: 11/26/2020 7:28 AM Reason for block: surgical anesthesia Staffing Performed: anesthesiologist  Anesthesiologist: Lowella Curb, MD Preanesthetic Checklist Completed: patient identified, IV checked, risks and benefits discussed, surgical consent, monitors and equipment checked, pre-op evaluation and timeout performed Spinal Block Patient position: sitting Prep: DuraPrep and site prepped and draped Patient monitoring: heart rate, cardiac monitor, continuous pulse ox and blood pressure Approach: midline Location: L3-4 Injection technique: single-shot Needle Needle type: Pencan  Needle gauge: 24 G Needle length: 10 cm Assessment Sensory level: T4 Events: CSF return

## 2020-11-26 NOTE — Op Note (Signed)
Cheryl Collins PROCEDURE DATE: 11/26/2020  PREOPERATIVE DIAGNOSIS: Intrauterine pregnancy at  [redacted]w[redacted]d weeks gestation, previous cesarean delivery x 2, desires sterility  POSTOPERATIVE DIAGNOSIS: The same  PROCEDURE:  Repeat Low Transverse Cesarean Section, bilateral tubal ligation  SURGEON:  Dr. Mitchel Honour  INDICATIONS: Cheryl Collins is a 30 y.o. [id number] at [redacted]w[redacted]d scheduled for cesarean section secondary and BTL to desire for repeat and for sterility.  The risks of cesarean section discussed with the patient included but were not limited to: bleeding which may require transfusion or reoperation; infection which may require antibiotics; injury to bowel, bladder, ureters or other surrounding organs; injury to the fetus; need for additional procedures including hysterectomy in the event of a life-threatening hemorrhage; placental abnormalities wth subsequent pregnancies, incisional problems, thromboembolic phenomenon and other postoperative/anesthesia complications. The patient concurred with the proposed plan, giving informed written consent for the procedure.    FINDINGS:  Viable female infant in cephalic presentation, APGARs 8,9: weight pending  Clear amniotic fluid.  Intact placenta, three vessel cord.  Grossly normal uterus, ovaries and fallopian tubes. .   ANESTHESIA: Spinal ESTIMATED BLOOD LOSS: 172 ml SPECIMENS: Placenta sent to L&D, bilateral tubal segments to pathology COMPLICATIONS: None immediate  PROCEDURE IN DETAIL:  The patient received intravenous antibiotics and had sequential compression devices applied to her lower extremities while in the preoperative area.  She was then taken to the operating room where spinal anesthesia was administered and was found to be adequate. She was then placed in a dorsal supine position with a leftward tilt, and prepped and draped in a sterile manner.  A foley catheter was placed into her bladder and attached to constant gravity.  After an adequate timeout  was performed, a Pfannenstiel skin incision was made with scalpel and carried through to the underlying layer of fascia. The fascia was incised in the midline and this incision was extended bilaterally using the Mayo scissors. Kocher clamps were applied to the superior aspect of the fascial incision and the underlying rectus muscles were dissected off bluntly. A similar process was carried out on the inferior aspect of the facial incision. The rectus muscles were separated in the midline bluntly and the peritoneum was entered bluntly. Bladder flap was created sharply and developed bluntly.  Bladder blade was placed.  A transverse hysterotomy was made with a scalpel and extended bilaterally bluntly. The bladder blade was then removed. The infant was successfully delivered, and cord was clamped and cut and infant was handed over to awaiting neonatology team. Uterine massage was then administered and the placenta delivered intact with three-vessel cord. The uterus was cleared of clot and debris.  The hysterotomy was closed with 0 chromic.  A second imbricating suture of 0-chromic was used to reinforce the incision and aid in hemostasis.  Attention was turned to the fallopian tubes.  Left fallopian tube was elevated and doubly tied with plain gut.  Tubal knuckle was excised and hemostasis noted.  The contralateral tube was treated in the same fashion.  The uterus was returned to the abdomen and both tubal sites continued to be hemostatic.  Irrigation was performed.  The peritoneum and rectus muscles were noted to be hemostatic and were reapproximated using 2-0 monocryl in a running fashion.  The fascia was closed with 0-PDS in a running fashion with good restoration of anatomy.  The subcutaneus tissue was copiously irrigated.  The skin was closed with 4-0 vicryl in a subcuticular fashion.  Pt tolerated the procedure will.  All counts were  correct x2.  Pt went to the recovery room in stable condition.

## 2020-11-26 NOTE — Lactation Note (Signed)
This note was copied from a baby's chart. Lactation Consultation Note  Patient Name: Boy Andrienne Havener YJEHU'D Date: 11/26/2020 Reason for consult: Initial assessment Age:31 hours  P3, BF other children for approx 3 mos. Denies questions or concerns. Will call if further assistance is needed. Feed on demand with cues.  Goal 8-12+ times per day after first 24 hrs.  Place baby STS if not cueing.  Mom made aware of O/P services, breastfeeding support groups, and our phone # for post-discharge questions.    Maternal Data Has patient been taught Hand Expression?: Yes Does the patient have breastfeeding experience prior to this delivery?: Yes How long did the patient breastfeed?: 12-13 weeks     LATCH Score Latch: Repeated attempts needed to sustain latch, nipple held in mouth throughout feeding, stimulation needed to elicit sucking reflex.  Audible Swallowing: A few with stimulation  Type of Nipple: Everted at rest and after stimulation  Comfort (Breast/Nipple): Soft / non-tender  Hold (Positioning): Assistance needed to correctly position infant at breast and maintain latch.  LATCH Score: 7      Interventions Interventions: Breast feeding basics reviewed      Consult Status Consult Status: PRN    Dahlia Byes [facility] 11/26/2020, 11:13 AM

## 2020-11-26 NOTE — Transfer of Care (Signed)
Immediate Anesthesia Transfer of Care Note  Patient: Cheryl Collins  Procedure(s) Performed: REPEAT CESAREAN SECTION WITH BILATERAL TUBAL LIGATION EDC: 11-29-20 ALLERG: AMOXICILLIN, CECLOR (Bilateral)  Patient Location: PACU  Anesthesia Type:Spinal  Level of Consciousness: awake, alert  and oriented  Airway & Oxygen Therapy: Patient Spontanous Breathing  Post-op Assessment: Report given to RN and Post -op Vital signs reviewed and stable  Post vital signs: Reviewed and stable  Last Vitals:  Vitals Value Taken Time  BP 106/56 11/26/20 0845  Temp    Pulse 73 11/26/20 0846  Resp 11 11/26/20 0846  SpO2 99 % 11/26/20 0846  Vitals shown include unvalidated device data.  Last Pain:  Vitals:   11/26/20 0633  TempSrc:   PainSc: 0-No pain         Complications: No notable events documented.

## 2020-11-26 NOTE — Anesthesia Postprocedure Evaluation (Signed)
Anesthesia Post Note  Patient: Paulita Fujita  Procedure(s) Performed: REPEAT CESAREAN SECTION WITH BILATERAL TUBAL LIGATION EDC: 11-29-20 ALLERG: AMOXICILLIN, CECLOR (Bilateral)     Patient location during evaluation: PACU Anesthesia Type: Spinal Level of consciousness: awake and alert Pain management: pain level controlled Vital Signs Assessment: post-procedure vital signs reviewed and stable Respiratory status: spontaneous breathing, nonlabored ventilation and respiratory function stable Cardiovascular status: blood pressure returned to baseline and stable Postop Assessment: no apparent nausea or vomiting Anesthetic complications: no   No notable events documented.  Last Vitals:  Vitals:   11/26/20 0930 11/26/20 0951  BP: 111/65 108/63  Pulse: 71 (!) 57  Resp: 18 18  Temp:  36.9 C  SpO2: 100% 98%    Last Pain:  Vitals:   11/26/20 0951  TempSrc: Oral  PainSc: 0-No pain   Pain Goal:                Epidural/Spinal Function Cutaneous sensation: Normal sensation (11/26/20 0951), Patient able to flex knees: Yes (11/26/20 0951), Patient able to lift hips off bed: Yes (11/26/20 0951), Back pain beyond tenderness at insertion site: No (11/26/20 0951), Progressively worsening motor and/or sensory loss: No (11/26/20 0951), Bowel and/or bladder incontinence post epidural: No (11/26/20 0951)  Lowella Curb

## 2020-11-27 LAB — CBC
HCT: 31 % — ABNORMAL LOW (ref 36.0–46.0)
Hemoglobin: 10.2 g/dL — ABNORMAL LOW (ref 12.0–15.0)
MCH: 28.4 pg (ref 26.0–34.0)
MCHC: 32.9 g/dL (ref 30.0–36.0)
MCV: 86.4 fL (ref 80.0–100.0)
Platelets: 261 10*3/uL (ref 150–400)
RBC: 3.59 MIL/uL — ABNORMAL LOW (ref 3.87–5.11)
RDW: 13.3 % (ref 11.5–15.5)
WBC: 16.5 10*3/uL — ABNORMAL HIGH (ref 4.0–10.5)
nRBC: 0 % (ref 0.0–0.2)

## 2020-11-27 LAB — SURGICAL PATHOLOGY

## 2020-11-27 NOTE — Progress Notes (Signed)
Subjective: Postpartum Day 1: Cesarean Delivery Patient reports tolerating PO, + flatus, and no problems voiding.    Objective: Vital signs in last 24 hours: Temp:  [97.5 F (36.4 C)-98.5 F (36.9 C)] 97.5 F (36.4 C) (07/08 0444) Pulse Rate:  [57-84] 62 (07/08 0444) Resp:  [11-25] 18 (07/08 0444) BP: (106-123)/(56-69) 123/69 (07/08 0444) SpO2:  [98 %-100 %] 100 % (07/07 1719)  Physical Exam:  General: alert, cooperative, and no distress Lochia: appropriate Uterine Fundus: firm Incision: healing well DVT Evaluation: No evidence of DVT seen on physical exam.  Recent Labs    11/24/20 1103 11/27/20 0540  HGB 11.4* 10.2*  HCT 35.1* 31.0*    Assessment/Plan: Status post Cesarean section. Doing well postoperatively.  Continue current care. D/W circumcision of newborn boy baby. Procedure and risks discussed. She states she understands and agrees. Roselle Locus II 11/27/2020, 7:14 AM

## 2020-11-28 MED ORDER — OXYCODONE HCL 5 MG PO TABS: 5.0000 mg | ORAL_TABLET | Freq: Four times a day (QID) | ORAL | 0 refills | Status: AC | PRN

## 2020-11-28 MED ORDER — IBUPROFEN 800 MG PO TABS: 800.0000 mg | ORAL_TABLET | Freq: Three times a day (TID) | ORAL | 0 refills | Status: AC | PRN

## 2020-11-28 MED ORDER — ACETAMINOPHEN 500 MG PO TABS: 1000.0000 mg | ORAL_TABLET | Freq: Four times a day (QID) | ORAL | 0 refills | Status: AC | PRN

## 2020-11-28 NOTE — Discharge Summary (Signed)
Postpartum Discharge Summary  Date of Service updated7/9/22     Patient Name: Cheryl Collins DOB: 1990-06-18 MRN: [id number]  Date of admission: 11/26/2020 Delivery date:11/26/2020  Delivering provider: Linda Hedges  Date of discharge: 11/28/2020  Admitting diagnosis: Previous cesarean section [Z98.891] S/P cesarean section [Z98.891] Intrauterine pregnancy: [redacted]w[redacted]d    Secondary diagnosis:  Active Problems:   Previous cesarean section  Additional problems:     Discharge diagnosis: Term Pregnancy Delivered                                              Post partum procedures: Augmentation:  Complications: None  Hospital course: Sceduled C/S   30y.o. yo [id number] at [id number] admitted to the hospital 11/26/2020 for scheduled cesarean section with the following indication:Elective Repeat.Delivery details are as follows:  Membrane Rupture Time/Date: 7:52 AM ,11/26/2020   Delivery Method:C-Section, Low Transverse  Details of operation can be found in separate operative note.  Patient had an uncomplicated postpartum course.  She is ambulating, tolerating a regular diet, passing flatus, and urinating well. Patient is discharged home in stable condition on  11/28/20        Newborn Data: Birth date:11/26/2020  Birth time:7:52 AM  Gender:Female  Living status:Living  Apgars:8 ,9  Weight:3805 g     Magnesium Sulfate received: No BMZ received: No Rhophylac:No MMR:No T-DaP:Given prenatally Flu: No Transfusion:No  Physical exam  Vitals:   11/27/20 0444 11/27/20 1400 11/27/20 2100 11/28/20 0615  BP: 123/69 (!) 102/47 (!) 113/54 (!) 108/53  Pulse: 62 69 73 60  Resp: _0 Temp: (!) 97.5 F (36.4 C) (!) 97.5 F (36.4 C) 98.4 F (36.9 C) 97.8 F (36.6 C)  TempSrc: Oral Oral Oral Oral  SpO2:  100% 100% 100%  Weight:      Height:       General: alert, cooperative, and no distress Lochia: appropriate Uterine Fundus: firm Incision: Healing well with no significant drainage DVT  Evaluation: No evidence of DVT seen on physical exam. Labs: Lab Results  Component Value Date   WBC 16.5 (H) 11/27/2020   HGB 10.2 (L) 11/27/2020   HCT 31.0 (L) 11/27/2020   MCV 86.4 11/27/2020   PLT 261 11/27/2020   CMP Latest Ref Rng & Units 04/23/2017  Glucose 65 - 99 mg/dL 88  BUN 6 - 20 mg/dL 9  Creatinine 0.44 - 1.00 mg/dL 1.16(H)  Sodium 135 - 145 mmol/L 136  Potassium 3.5 - 5.1 mmol/L 3.8  Chloride 101 - 111 mmol/L 104  CO2 22 - 32 mmol/L 26  Calcium 8.9 - 10.3 mg/dL 9.0  Total Protein 6.0 - 8.3 g/dL -  Total Bilirubin 0.3 - 1.2 mg/dL -  Alkaline Phos 39 - 117 U/L -  AST 0 - 37 U/L -  ALT 0 - 35 U/L -   Edinburgh Score: Edinburgh Postnatal Depression Scale Screening Tool 11/26/2020  I have been able to laugh and see the funny side of things. (No Data)  I have looked forward with enjoyment to things. -  I have blamed myself unnecessarily when things went wrong. -  I have been anxious or worried for no good reason. -  I have felt scared or panicky for no good reason. -  Things have been getting on top of me. -  I have been so unhappy that  I have had difficulty sleeping. -  I have felt sad or miserable. -  I have been so unhappy that I have been crying. -  The thought of harming myself has occurred to me. Flavia Shipper Postnatal Depression Scale Total -      After visit meds:  Allergies as of 11/28/2020       Reactions   Amoxil [amoxicillin] Hives   Has patient had a PCN reaction causing immediate rash, facial/tongue/throat swelling, SOB or lightheadedness with hypotension: Unknown Has patient had a PCN reaction causing severe rash involving mucus membranes or skin necrosis: Unknown Has patient had a PCN reaction that required hospitalization: No Has patient had a PCN reaction occurring within the last 10 years: No If all of the above answers are "NO", then may proceed with Cephalosporin use.   Ceclor [cefaclor] Hives        Medication List     STOP taking  these medications    morphine 15 MG tablet Commonly known as: MSIR   omeprazole 20 MG tablet Commonly known as: PRILOSEC OTC       TAKE these medications    acetaminophen 500 MG tablet Commonly known as: TYLENOL Take 2 tablets (1,000 mg total) by mouth every 6 (six) hours as needed.   ibuprofen 800 MG tablet Commonly known as: ADVIL Take 1 tablet (800 mg total) by mouth every 8 (eight) hours as needed.   oxyCODONE 5 MG immediate release tablet Commonly known as: Oxy IR/ROXICODONE Take 1 tablet (5 mg total) by mouth every 6 (six) hours as needed for moderate pain.   prenatal multivitamin Tabs tablet Take 1 tablet by mouth daily at 12 noon.         Discharge home in stable condition Infant Feeding: Breast Infant Disposition:home with mother Discharge instruction: per After Visit Summary and Postpartum booklet. Activity: Advance as tolerated. Pelvic rest for 6 weeks.  Diet: routine diet Anticipated Birth Control:  BTL at C/S Postpartum Appointment:6 weeks Additional Postpartum F/U:  Future Appointments:No future appointments. Follow up Visit:      11/28/2020 Allena Katz, MD

## 2020-12-09 ENCOUNTER — Telehealth (HOSPITAL_COMMUNITY): Payer: Self-pay

## 2020-12-09 NOTE — Telephone Encounter (Signed)
Follow-up discharge call, doing well no concerns, incision healing well.  Breastfeeding well, had peds appointment. Reviewed safe sleep.  EPDS 4

## 2021-03-12 ENCOUNTER — Other Ambulatory Visit: Payer: Self-pay | Admitting: Obstetrics & Gynecology

## 2021-03-12 DIAGNOSIS — N644 Mastodynia: Secondary | ICD-10-CM

## 2021-03-29 ENCOUNTER — Other Ambulatory Visit: Payer: Self-pay | Admitting: Obstetrics & Gynecology

## 2021-03-29 ENCOUNTER — Other Ambulatory Visit: Payer: Self-pay

## 2021-03-29 ENCOUNTER — Ambulatory Visit
Admission: RE | Admit: 2021-03-29 | Discharge: 2021-03-29 | Disposition: A | Discharge status: Home / Self Care | Payer: BC Managed Care – PPO | Source: Ambulatory Visit | Attending: Obstetrics & Gynecology | Admitting: Obstetrics & Gynecology

## 2021-03-29 DIAGNOSIS — N644 Mastodynia: Secondary | ICD-10-CM

## 2021-09-27 ENCOUNTER — Other Ambulatory Visit: Payer: BC Managed Care – PPO

## 2022-06-08 IMAGING — MG DIGITAL DIAGNOSTIC BILAT W/ TOMO W/ CAD
8 of 14 series · 8 of 40 positions shown · non-contrast
Comparison: Previous exam(s).
COMPARISON: None

*** End of Addendum ***
COMPARISON: Previous exam(s).

Addendum:
CLINICAL DATA: 30-year-old female presenting with a new lump in the
right breast for approximately 1 month.

EXAM:
DIGITAL DIAGNOSTIC BILATERAL MAMMOGRAM WITH TOMOSYNTHESIS AND CAD;
ULTRASOUND RIGHT BREAST LIMITED
TECHNIQUE: Bilateral digital diagnostic mammography and breast tomosynthesis
was performed. The images were evaluated with computer-aided
detection.; Targeted ultrasound examination of the right breast was
performed

[R CC synth-2D (1 of 2)]
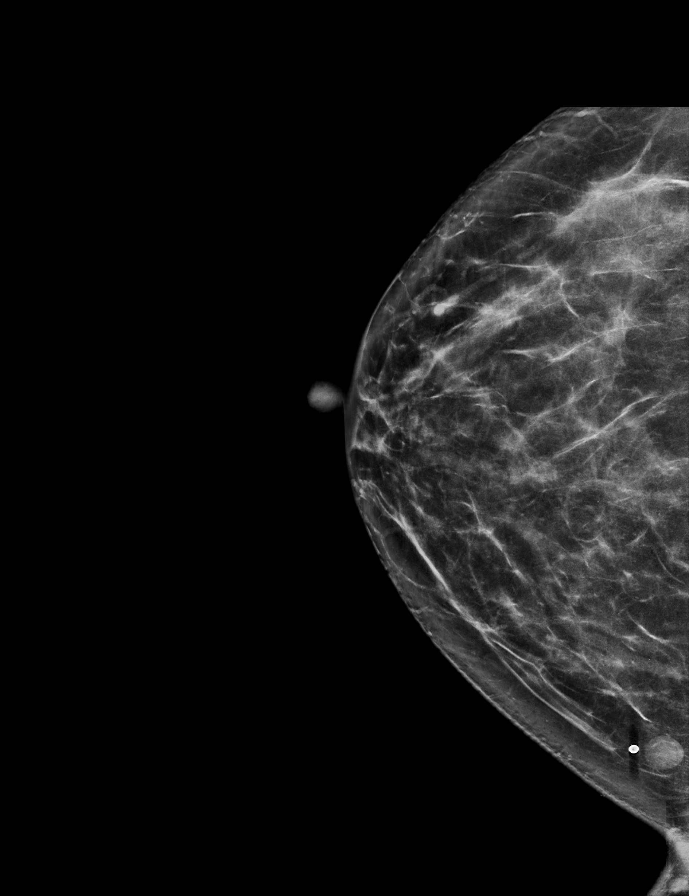

[R CC synth-2D (2 of 2)]
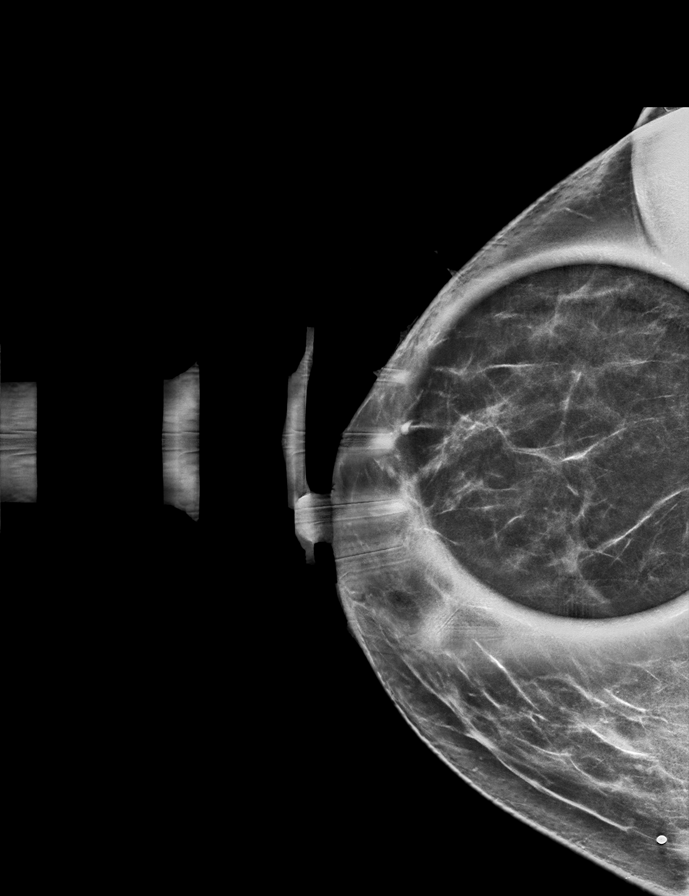

[L MLO synth-2D]
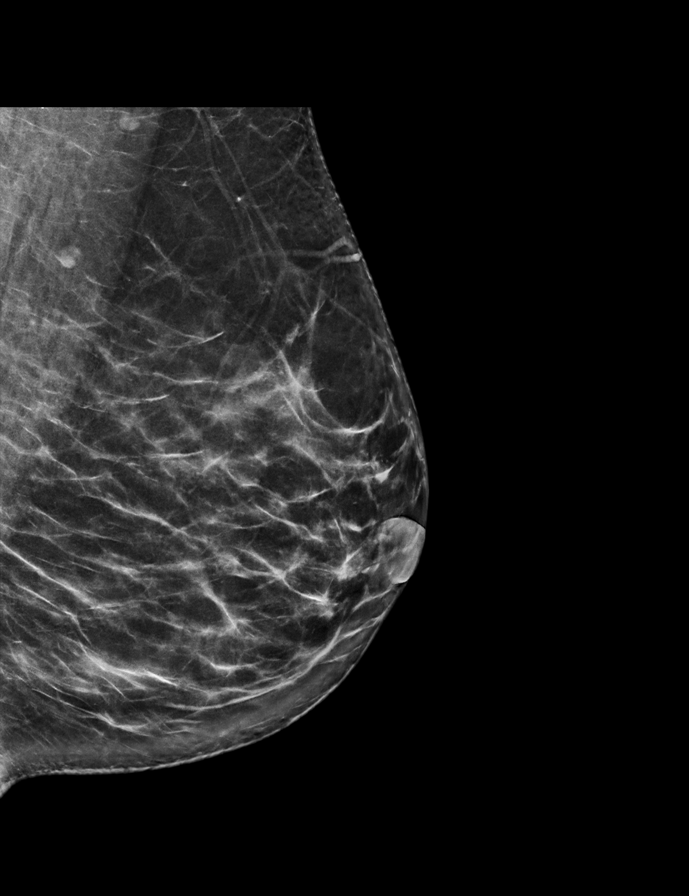

[L CC synth-2D]
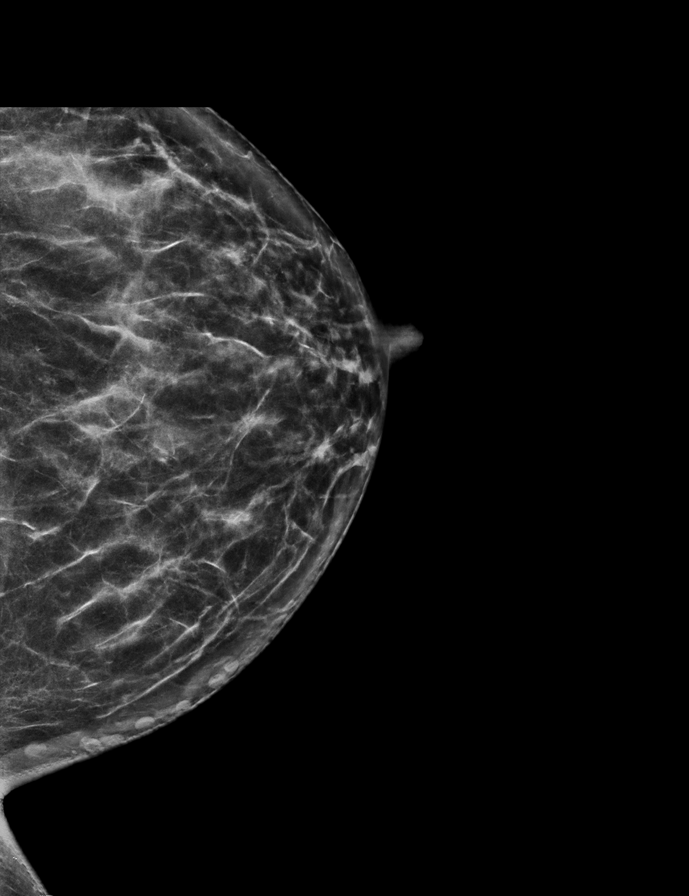

[R MLO synth-2D (1 of 2)]
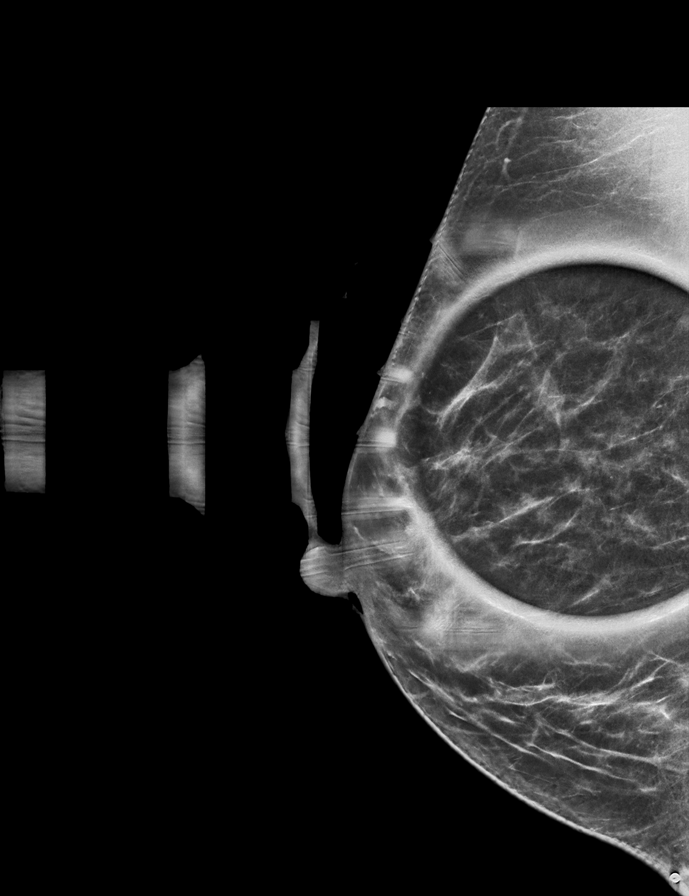

[R MLO synth-2D (2 of 2)]
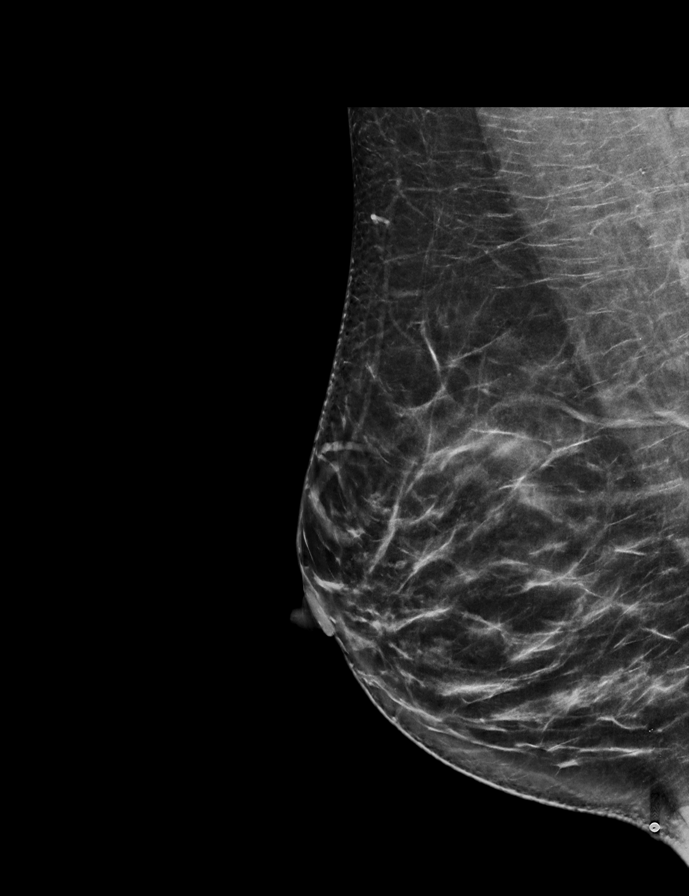

[R TAN synth-2D]
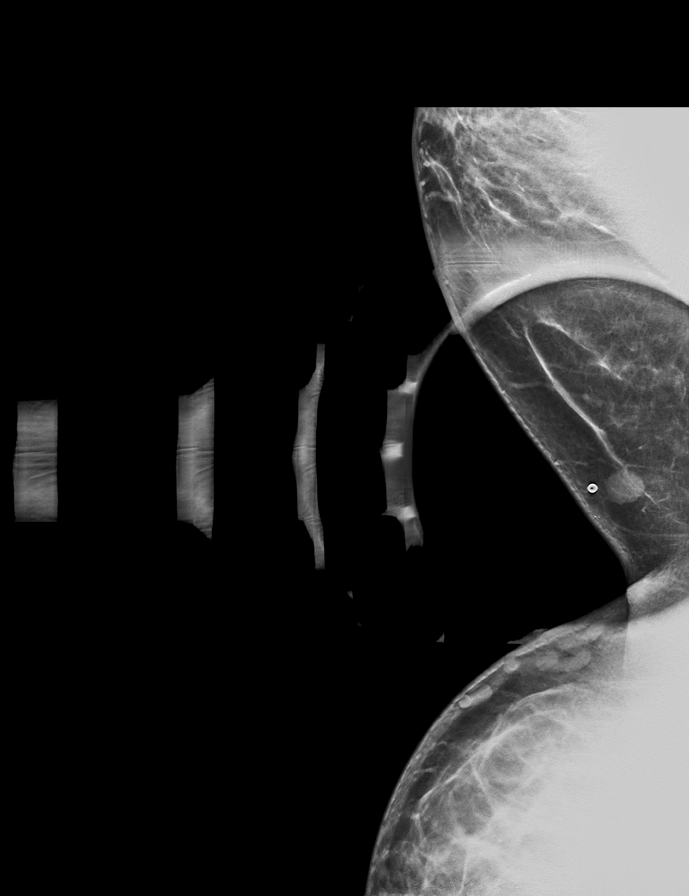

[R MLO tomo · tomo slice 31/60.0]
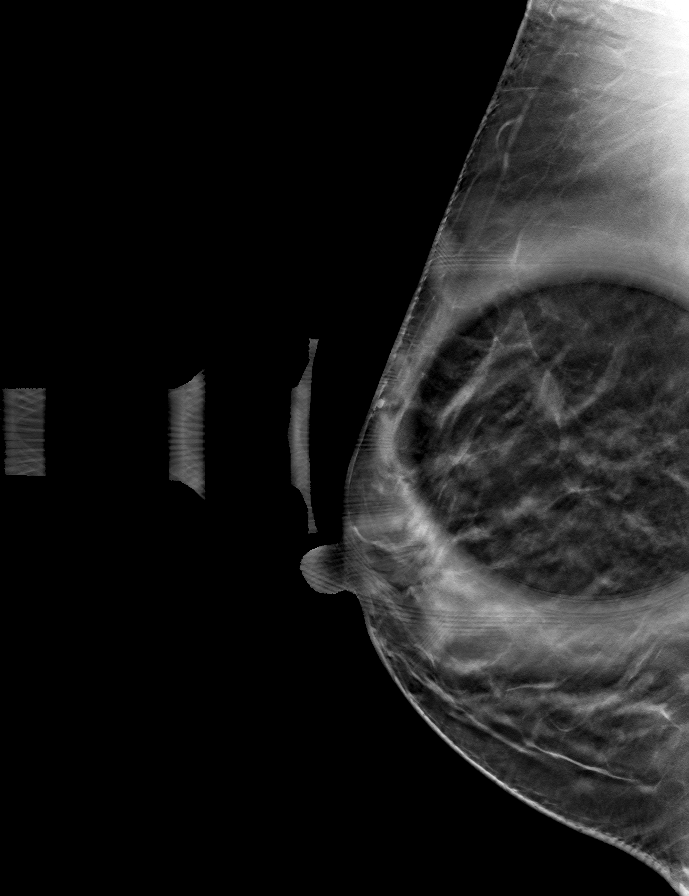

[8 of 40 positions shown; findings below may reference images not displayed]

ACR Breast Density Category b: There are scattered areas of
fibroglandular density.
FINDINGS: Mammogram:

A skin BB marks the palpable site of concern reported by the patient
in the far lower inner right breast. A spot tangential view of this
area was performed in addition to standard views. At the palpable
site there is an oval circumscribed mass measuring approximately
cm. Additional spot compression tomosynthesis views were performed
for a possible asymmetry in the upper outer right breast. The
asymmetry resolves on the additional imaging.

On physical exam at the site of concern reported by the patient I
feel a discrete superficial nodule. There are no overlying skin
changes.

Ultrasound:

Targeted ultrasound performed in the right breast at the palpable
site demonstrating an oval circumscribed subdermal cystic mass with
internal echoes measuring 0.8 x 0.5 x 1.0 cm, most likely a
sebaceous cyst. No internal vascularity.

Targeted ultrasound the right axilla demonstrates normal lymph
nodes.
IMPRESSION: 1. Probably benign mass at the palpable site of concern in the right
breast, likely a sebaceous cyst.

2.  No mammographic evidence of malignancy in the left breast.

RECOMMENDATION:
Right breast ultrasound in 6 months. Patient was counseled to
monitor for interval growth at home.

I have discussed the findings and recommendations with the patient.
If applicable, a reminder letter will be sent to the patient
regarding the next appointment.

BI-RADS CATEGORY  3: Probably benign.
ACR Breast Density Category b: There are scattered areas of
fibroglandular density.
FINDINGS: Mammogram:

A skin BB marks the palpable site of concern reported by the patient
in the far lower inner right breast. A spot tangential view of this
area was performed in addition to standard views. At the palpable
site there is an oval circumscribed mass measuring approximately
cm. Additional spot compression tomosynthesis views were performed
for a possible asymmetry in the upper outer right breast. The
asymmetry resolves on the additional imaging.

On physical exam at the site of concern reported by the patient I
feel a discrete superficial nodule. There are no overlying skin
changes.

Ultrasound:

Targeted ultrasound performed in the right breast at the palpable
site demonstrating an oval circumscribed subdermal cystic mass with
internal echoes measuring 0.8 x 0.5 x 1.0 cm, most likely a
sebaceous cyst. No internal vascularity.

Targeted ultrasound the right axilla demonstrates normal lymph
nodes.
IMPRESSION: 1. Probably benign mass at the palpable site of concern in the right
breast, likely a sebaceous cyst.

2.  No mammographic evidence of malignancy in the left breast.

RECOMMENDATION:
Right breast ultrasound in 6 months. Patient was counseled to
monitor for interval growth at home.

I have discussed the findings and recommendations with the patient.
If applicable, a reminder letter will be sent to the patient
regarding the next appointment.

BI-RADS CATEGORY  3: Probably benign.
